# Patient Record
Sex: Female | Born: 1945 | Race: White | Hispanic: No | Smoking: Current every day smoker
Health system: Southern US, Community
[De-identification: ages and names within clinical notes are randomized; demographics above are authoritative.]

## PROBLEM LIST (undated history)

## (undated) DIAGNOSIS — R350 Frequency of micturition: Secondary | ICD-10-CM

## (undated) DIAGNOSIS — R569 Unspecified convulsions: Secondary | ICD-10-CM

## (undated) HISTORY — PX: KNEE ARTHROSCOPY: SUR90

## (undated) HISTORY — PX: OTHER SURGICAL HISTORY: SHX169

---

## 2008-05-06 ENCOUNTER — Emergency Department (HOSPITAL_BASED_OUTPATIENT_CLINIC_OR_DEPARTMENT_OTHER): Admission: EM | Admit: 2008-05-06 | Discharge: 2008-05-06 | Payer: Self-pay | Admitting: Emergency Medicine

## 2009-11-13 ENCOUNTER — Ambulatory Visit: Payer: Self-pay | Admitting: Family Medicine

## 2009-11-13 DIAGNOSIS — M545 Low back pain: Secondary | ICD-10-CM

## 2009-11-13 DIAGNOSIS — S8000XA Contusion of unspecified knee, initial encounter: Secondary | ICD-10-CM

## 2009-11-13 DIAGNOSIS — S139XXA Sprain of joints and ligaments of unspecified parts of neck, initial encounter: Secondary | ICD-10-CM

## 2009-11-13 DIAGNOSIS — S239XXA Sprain of unspecified parts of thorax, initial encounter: Secondary | ICD-10-CM

## 2009-11-13 DIAGNOSIS — R569 Unspecified convulsions: Secondary | ICD-10-CM | POA: Insufficient documentation

## 2009-11-19 ENCOUNTER — Encounter: Admission: RE | Admit: 2009-11-19 | Discharge: 2009-11-19 | Payer: Self-pay | Admitting: Orthopedic Surgery

## 2009-11-25 ENCOUNTER — Encounter: Admission: RE | Admit: 2009-11-25 | Discharge: 2010-02-08 | Payer: Self-pay | Admitting: Orthopedic Surgery

## 2010-07-13 NOTE — Letter (Signed)
Summary: Out of Work  [facility]  [address]   [place], [place] [address]   Phone: [phone]  Fax: [phone]    November 13, 2009   Employee:  Stacey Woodard    To Whom It May Concern:   For Medical reasons, please excuse the above named employee from work today.    If you need additional information, please feel free to contact our office.         Sincerely,    Donna Christen MD
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
## 2011-08-03 ENCOUNTER — Ambulatory Visit
Admission: RE | Admit: 2011-08-03 | Discharge: 2011-08-03 | Disposition: A | Discharge status: Home / Self Care | Payer: No Typology Code available for payment source | Source: Ambulatory Visit | Attending: Family Medicine | Admitting: Family Medicine

## 2011-08-03 ENCOUNTER — Other Ambulatory Visit: Payer: Self-pay | Admitting: Family Medicine

## 2011-08-03 DIAGNOSIS — R609 Edema, unspecified: Secondary | ICD-10-CM

## 2011-08-03 DIAGNOSIS — R52 Pain, unspecified: Secondary | ICD-10-CM

## 2011-08-29 ENCOUNTER — Other Ambulatory Visit: Payer: Self-pay | Admitting: Family Medicine

## 2011-08-29 ENCOUNTER — Ambulatory Visit
Admission: RE | Admit: 2011-08-29 | Discharge: 2011-08-29 | Disposition: A | Discharge status: Home / Self Care | Payer: No Typology Code available for payment source | Source: Ambulatory Visit | Attending: Family Medicine | Admitting: Family Medicine

## 2011-08-29 DIAGNOSIS — R52 Pain, unspecified: Secondary | ICD-10-CM

## 2011-10-05 ENCOUNTER — Ambulatory Visit: Payer: Worker's Compensation | Attending: Orthopedic Surgery | Admitting: Physical Therapy

## 2011-10-05 DIAGNOSIS — M2560 Stiffness of unspecified joint, not elsewhere classified: Secondary | ICD-10-CM | POA: Insufficient documentation

## 2011-10-05 DIAGNOSIS — M25539 Pain in unspecified wrist: Secondary | ICD-10-CM | POA: Insufficient documentation

## 2011-10-05 DIAGNOSIS — IMO0001 Reserved for inherently not codable concepts without codable children: Secondary | ICD-10-CM | POA: Insufficient documentation

## 2011-10-07 ENCOUNTER — Ambulatory Visit: Payer: Worker's Compensation | Admitting: Physical Therapy

## 2011-10-11 ENCOUNTER — Ambulatory Visit: Payer: Worker's Compensation | Admitting: Physical Therapy

## 2011-10-14 ENCOUNTER — Ambulatory Visit: Payer: Worker's Compensation | Attending: Orthopedic Surgery | Admitting: Physical Therapy

## 2011-10-14 DIAGNOSIS — M25539 Pain in unspecified wrist: Secondary | ICD-10-CM | POA: Insufficient documentation

## 2011-10-14 DIAGNOSIS — M2560 Stiffness of unspecified joint, not elsewhere classified: Secondary | ICD-10-CM | POA: Insufficient documentation

## 2011-10-14 DIAGNOSIS — IMO0001 Reserved for inherently not codable concepts without codable children: Secondary | ICD-10-CM | POA: Insufficient documentation

## 2011-10-18 ENCOUNTER — Encounter: Payer: Self-pay | Admitting: Physical Therapy

## 2011-10-21 ENCOUNTER — Ambulatory Visit: Payer: Worker's Compensation | Admitting: Physical Therapy

## 2011-10-25 ENCOUNTER — Ambulatory Visit: Payer: Worker's Compensation | Attending: Orthopedic Surgery | Admitting: Physical Therapy

## 2011-10-25 DIAGNOSIS — M25539 Pain in unspecified wrist: Secondary | ICD-10-CM | POA: Insufficient documentation

## 2011-10-25 DIAGNOSIS — IMO0001 Reserved for inherently not codable concepts without codable children: Secondary | ICD-10-CM | POA: Insufficient documentation

## 2011-10-25 DIAGNOSIS — M2560 Stiffness of unspecified joint, not elsewhere classified: Secondary | ICD-10-CM | POA: Insufficient documentation

## 2011-10-28 ENCOUNTER — Ambulatory Visit: Payer: Worker's Compensation | Admitting: Physical Therapy

## 2011-11-01 ENCOUNTER — Ambulatory Visit: Payer: Worker's Compensation | Attending: Orthopedic Surgery | Admitting: Physical Therapy

## 2011-11-01 DIAGNOSIS — IMO0001 Reserved for inherently not codable concepts without codable children: Secondary | ICD-10-CM | POA: Insufficient documentation

## 2011-11-01 DIAGNOSIS — M25539 Pain in unspecified wrist: Secondary | ICD-10-CM | POA: Insufficient documentation

## 2011-11-01 DIAGNOSIS — M2560 Stiffness of unspecified joint, not elsewhere classified: Secondary | ICD-10-CM | POA: Insufficient documentation

## 2011-11-03 ENCOUNTER — Ambulatory Visit: Payer: Worker's Compensation | Attending: Orthopedic Surgery | Admitting: Physical Therapy

## 2011-11-03 DIAGNOSIS — IMO0001 Reserved for inherently not codable concepts without codable children: Secondary | ICD-10-CM | POA: Insufficient documentation

## 2011-11-03 DIAGNOSIS — M25539 Pain in unspecified wrist: Secondary | ICD-10-CM | POA: Insufficient documentation

## 2011-11-03 DIAGNOSIS — M2560 Stiffness of unspecified joint, not elsewhere classified: Secondary | ICD-10-CM | POA: Insufficient documentation

## 2011-11-04 ENCOUNTER — Encounter: Payer: Self-pay | Admitting: Physical Therapy

## 2011-11-08 ENCOUNTER — Encounter: Payer: Self-pay | Admitting: Physical Therapy

## 2011-11-11 ENCOUNTER — Encounter: Payer: Self-pay | Admitting: Physical Therapy

## 2014-01-09 ENCOUNTER — Encounter: Payer: Self-pay | Admitting: Emergency Medicine

## 2014-01-09 ENCOUNTER — Emergency Department (INDEPENDENT_AMBULATORY_CARE_PROVIDER_SITE_OTHER): Payer: Self-pay

## 2014-01-09 ENCOUNTER — Emergency Department (INDEPENDENT_AMBULATORY_CARE_PROVIDER_SITE_OTHER)
Admission: EM | Admit: 2014-01-09 | Discharge: 2014-01-09 | Disposition: A | Discharge status: Home / Self Care | Payer: Self-pay | Source: Home / Self Care

## 2014-01-09 DIAGNOSIS — S46819A Strain of other muscles, fascia and tendons at shoulder and upper arm level, unspecified arm, initial encounter: Secondary | ICD-10-CM

## 2014-01-09 DIAGNOSIS — R202 Paresthesia of skin: Secondary | ICD-10-CM

## 2014-01-09 DIAGNOSIS — M25519 Pain in unspecified shoulder: Secondary | ICD-10-CM

## 2014-01-09 DIAGNOSIS — S46212A Strain of muscle, fascia and tendon of other parts of biceps, left arm, initial encounter: Secondary | ICD-10-CM

## 2014-01-09 DIAGNOSIS — M25529 Pain in unspecified elbow: Secondary | ICD-10-CM

## 2014-01-09 DIAGNOSIS — S43499A Other sprain of unspecified shoulder joint, initial encounter: Secondary | ICD-10-CM

## 2014-01-09 DIAGNOSIS — R200 Anesthesia of skin: Secondary | ICD-10-CM

## 2014-01-09 DIAGNOSIS — Y990 Civilian activity done for income or pay: Secondary | ICD-10-CM

## 2014-01-09 HISTORY — DX: Frequency of micturition: R35.0

## 2014-01-09 HISTORY — DX: Unspecified convulsions: R56.9

## 2014-01-09 MED ORDER — HYDROCODONE-ACETAMINOPHEN 10-325 MG PO TABS: 1.0000 | ORAL_TABLET | Freq: Three times a day (TID) | ORAL | Status: DC | PRN

## 2014-01-09 MED ORDER — KETOROLAC TROMETHAMINE 30 MG/ML IJ SOLN
30.0000 mg | Freq: Once | INTRAMUSCULAR | Status: AC
  Administered 2014-01-09: 30 mg via INTRAMUSCULAR

## 2014-01-09 MED ORDER — MELOXICAM 15 MG PO TABS: ORAL_TABLET | ORAL | Status: DC
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
## 2014-01-09 NOTE — Discharge Instructions (Signed)
Biceps Tendon Tendinitis (Proximal) and Tenosynovitis with Rehab Tendonitis and tenosynovitis involve inflammation of the tendon and the tendon lining (sheath). The proximal biceps tendon is vulnerable to tendonitis and tenosynovitis, which causes pain and discomfort in the front of the shoulder and upper arm. The tendon lining secretes a fluid that helps lubricate the tendon, allowing for proper function without pain. When the tendon and its lining become inflamed, the tendon can no longer glide smoothly, causing pain. The proximal biceps tendon connects the biceps muscle to two bones of the shoulder. It is important for proper function of the elbow and turning the palm upward (supination) using the wrist. Proximal biceps tendon tendinitis may include a grade 1 or 2 strain of the tendon. Grade 1 strains involve a slight pull of the tendon without signs of tearing and no observed tendon lengthening. There is also no loss of strength. Grade 2 strains involve small tears in the tendon fibers. The tendon or muscle is stretched and strength is usually decreased.  SYMPTOMS   Pain, tenderness, swelling, warmth, or redness over the front of the shoulder.  Pain that gets worse with shoulder and elbow use, especially against resistance.  Limited motion of the shoulder or elbow.  Crackling sound (crepitation) when the tendon or shoulder is moved or touched. CAUSES  The symptoms of biceps tendonitis are due to inflammation of the tendon. Inflammation may be caused by:  Strain from sudden increase in amount or intensity of activity.  Direct blow or injury to the elbow (uncommon).  Overuse or repetitive elbow bending or wrist rotation, particularly when turning the palm up, or with elbow hyperextension. RISK INCREASES WITH:  Sports that involve contact or overhead arm activity (throwing sports, gymnastics, weightlifting, bodybuilding, rock climbing).  Heavy labor.  Poor strength and  flexibility.  Failure to warm up properly before activity. PREVENTION  Warm up and stretch properly before activity.  Allow time for recovery between activities.  Maintain physical fitness:  Strength, flexibility, and endurance.  Cardiovascular fitness.  Learn and use proper exercise technique. PROGNOSIS  With proper treatment, proximal biceps tendon tendonitis and tenosynovitis is usually curable within 6 weeks. Healing is usually quicker if the cause was a direct blow, not overuse.  RELATED COMPLICATIONS   Longer healing time if not properly treated or if not given enough time to heal.  Chronically inflamed tendon that causes persistent pain with activity, that may progress to constant pain and potentially rupture of the tendon.  Recurring symptoms, especially if activity is resumed too soon or with overuse, a direct blow, or use of poor exercise technique. TREATMENT Treatment first involves ice and medicine, to reduce pain and inflammation. It is helpful to modify activities that cause pain, to reduce the chances of causing the condition to get worse. Strengthening and stretching exercises should be performed to promote proper use of the muscles of the shoulder. These exercises may be performed at home or with a therapist. Other treatments may be given such as ultrasound or heat therapy. A corticosteroid injection may be recommended to help reduce inflammation of the tendon lining. Surgery is usually not necessary. Sometimes, if symptoms last for greater than 6 months, surgery will be advised to detach the tendon and re-insert it into the arm bone. Surgery to correct other shoulder problems that may be contributing to tendinitis may be advised before surgery for the tendinitis itself.  MEDICATION  If pain medicine is needed, nonsteroidal anti-inflammatory medicines (aspirin and ibuprofen), or other minor pain relievers (  acetaminophen), are often advised.  Do not take pain medicine  for 7 days before surgery.  Prescription pain relievers may be given if your caregiver thinks they are needed. Use only as directed and only as much as you need.  Corticosteroid injections may be given. These injections should only be used on the most severe cases, as one can only receive a limited number of them. HEAT AND COLD   Cold treatment (icing) should be applied for 10 to 15 minutes every 2 to 3 hours for inflammation and pain, and immediately after activity that aggravates your symptoms. Use ice packs or an ice massage.  Heat treatment may be used before performing stretching and strengthening activities prescribed by your caregiver, physical therapist, or athletic trainer. Use a heat pack or a warm water soak. SEEK MEDICAL CARE IF:   Symptoms get worse or do not improve in 2 weeks, despite treatment.  New, unexplained symptoms develop. (Drugs used in treatment may produce side effects.) EXERCISES RANGE OF MOTION (ROM) AND EXERCISES - Biceps Tendon (Proximal) and Tenosynovitis These exercises may help you when beginning to rehabilitate your injury. Your symptoms may go away with or without further involvement from your physician, physical therapist, or athletic trainer. While completing these exercises, remember:   Restoring tissue flexibility helps normal motion to return to the joints. This allows healthier, less painful movement and activity.  An effective stretch should be held for at least 30 seconds.  A stretch should never be painful. You should only feel a gentle lengthening or release in the stretched tissue. STRETCH - Flexion, Standing  Stand with good posture. With an underhand grip on your right / left hand and an overhand grip on the opposite hand, grasp a broomstick or cane so that your hands are a little more than shoulder width apart.  Keeping your right / left elbow straight and shoulder muscles relaxed, push the stick with your opposite hand to raise your right  / left arm in front of your body and then overhead. Raise your arm until you feel a stretch in your right / left shoulder, but before you have increased shoulder pain.  Try to avoid shrugging your right / left shoulder as your arm rises, by keeping your shoulder blade tucked down and toward your mid-back spine. Hold for __________ seconds.  Slowly return to the starting position. Repeat __________ times. Complete this exercise __________ times per day. STRETCH - Abduction, Supine  Lie on your back. With an underhand grip on your right / left hand and an overhand grip on the opposite hand, grasp a broomstick or cane so that your hands are a little more than shoulder width apart.  Keeping your right / left elbow straight and shoulder muscles relaxed, push the stick with your opposite hand to raise your right / left arm out to the side of your body and then overhead. Raise your arm until you feel a stretch in your right / left shoulder, but before you have increased shoulder pain.  Try to avoid shrugging your right / left shoulder as your arm rises, by keeping your shoulder blade tucked down and toward your mid-back spine. Hold for __________ seconds.  Slowly return to the starting position. Repeat __________ times. Complete this exercise __________ times per day. ROM - Flexion, Active-Assisted  Lie on your back. You may bend your knees for comfort.  Grasp a broomstick or cane so your hands are about shoulder width apart. Your right / left hand should   grip the end of the stick so that your hand is positioned "thumbs-up," as if you were about to shake hands.  Using your healthy arm to lead, raise your right / left arm overhead until you feel a gentle stretch in your shoulder. Hold for __________ seconds.  Use the stick to assist in returning your right / left arm to its starting position. Repeat __________ times. Complete this exercise __________ times per day.  STRETCH - Flexion, Standing    Stand facing a wall. Walk your right / left fingers up the wall until you feel a moderate stretch in your shoulder. As your hand gets higher, you may need to step closer to the wall or use a door frame to walk through.  Try to avoid shrugging your right / left shoulder as your arm rises, by keeping your shoulder blade tucked down and toward your mid-back spine.  Hold for __________ seconds. Use your other hand, if needed, to ease out of the stretch and return to the starting position. Repeat __________ times. Complete this exercise __________ times per day.  ROM - Internal Rotation   Using underhand grips, grasp a stick behind your back with both hands.  While standing upright with good posture, slide the stick up your back until you feel a mild stretch in the front of your shoulder.  Hold for __________ seconds. Slowly return to your starting position. Repeat __________ times. Complete this exercise __________ times per day.  STRETCH - Internal Rotation  Place your right / left hand behind your back, palm-up.  Throw a towel or belt over your opposite shoulder. Grasp the towel with your right / left hand.  While keeping an upright posture, gently pull up on the towel until you feel a stretch in the front of your right / left shoulder.  Avoid shrugging your right / left shoulder as your arm rises, by keeping your shoulder blade tucked down and toward your mid-back spine.  Hold for __________ seconds. Release the stretch by lowering your opposite hand. Repeat __________ times. Complete this exercise __________ times per day. STRENGTHENING EXERCISES - Biceps Tendon Tendinitis (Proximal) and Tenosynovitis These exercises may help you regain your strength after your physician has discontinued your restraint in a cast or brace. They may resolve your symptoms with or without further involvement from your physician, physical therapist or athletic trainer. While completing these exercises,  remember:   Muscles can gain both the endurance and the strength needed for everyday activities through controlled exercises.  Complete these exercises as instructed by your physician, physical therapist or athletic trainer. Increase the resistance and repetitions only as guided.  You may experience muscle soreness or fatigue, but the pain or discomfort you are trying to eliminate should never worsen during these exercises. If this pain does get worse, stop and make sure you are following the directions exactly. If the pain is still present after adjustments, discontinue the exercise until you can discuss the trouble with your caregiver. STRENGTH - Elbow Flexors, Isometric  Stand or sit upright on a firm surface. Place your right / left arm so that your hand is palm-up and at the height of your waist.  Place your opposite hand on top of your forearm. Gently push down as your right / left arm resists. Push as hard as you can with both arms, without causing any pain or movement at your right / left elbow. Hold this stationary position for __________ seconds.  Gradually release the tension in both   arms. Allow your muscles to relax completely before repeating. Repeat __________ times. Complete this exercise __________ times per day. STRENGTH - Shoulder Flexion, Isometric  With good posture and facing a wall, stand or sit about 4-6 inches away.  Keeping your right / left elbow straight, gently press the top of your fist into the wall. Increase the pressure gradually until you are pressing as hard as you can, without shrugging your shoulder or increasing any shoulder discomfort.  Hold for __________ seconds.  Release the tension slowly. Relax your shoulder muscles completely before you start the next repetition. Repeat __________ times. Complete this exercise __________ times per day.  STRENGTH - Elbow Flexors, Supinated  With good posture, stand or sit on a firm chair without armrests. Allow  your right / left arm to rest at your side with your palm facing forward.  Holding a __________ weight, or gripping a rubber exercise band or tubing,  bring your hand toward your shoulder.  Allow your muscles to control the resistance as your hand returns to your side. Repeat __________ times. Complete this exercise __________ times per day.  STRENGTH - Shoulder Flexion  Stand or sit with good posture. Grasp a __________ weight, or an exercise band or tubing, so that your hand is "thumbs-up," like when you shake hands.  Slowly lift your right / left arm as far as you can, without increasing any shoulder pain. At first, many people can only raise their hand to shoulder height.  Avoid shrugging your right / left shoulder as your arm rises, by keeping your shoulder blade tucked down and toward your mid-back spine.  Hold for __________ seconds. Control the descent of your hand as you slowly return to your starting position. Repeat __________ times. Complete this exercise __________ times per day. Document Released: 05/30/2005 Document Revised: 08/22/2011 Document Reviewed: 09/11/2008 [employer] Patient Information 2015 [employer]. This information is not intended to replace advice given to you by your health care provider. Make sure you discuss any questions you have with your health care provider.  
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
## 2014-01-12 NOTE — ED Provider Notes (Signed)
Agree with exam, assessment, and plan.   Lattie HawStephen A Beese, MD 01/12/14 [phone]

## 2014-01-17 ENCOUNTER — Ambulatory Visit (INDEPENDENT_AMBULATORY_CARE_PROVIDER_SITE_OTHER): Payer: Worker's Compensation | Admitting: Sports Medicine

## 2014-01-17 ENCOUNTER — Encounter: Payer: Self-pay | Admitting: Sports Medicine

## 2014-01-17 VITALS — BP 119/73 | HR 71 | Ht 62.0 in | Wt 175.0 lb

## 2014-01-17 DIAGNOSIS — M19012 Primary osteoarthritis, left shoulder: Secondary | ICD-10-CM | POA: Insufficient documentation

## 2014-01-17 DIAGNOSIS — M25512 Pain in left shoulder: Secondary | ICD-10-CM

## 2014-01-17 DIAGNOSIS — R569 Unspecified convulsions: Secondary | ICD-10-CM

## 2014-01-17 DIAGNOSIS — M25519 Pain in unspecified shoulder: Secondary | ICD-10-CM

## 2014-01-17 NOTE — Assessment & Plan Note (Signed)
Likely a left subscapularis strain. Formal physical therapy, Pennsaid samples given. Return to see me in 4 weeks.
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
## 2014-01-28 ENCOUNTER — Ambulatory Visit (INDEPENDENT_AMBULATORY_CARE_PROVIDER_SITE_OTHER): Payer: Worker's Compensation | Admitting: Physical Therapy

## 2014-01-28 DIAGNOSIS — M25519 Pain in unspecified shoulder: Secondary | ICD-10-CM

## 2014-01-28 DIAGNOSIS — M25619 Stiffness of unspecified shoulder, not elsewhere classified: Secondary | ICD-10-CM | POA: Diagnosis not present

## 2014-01-28 DIAGNOSIS — M6281 Muscle weakness (generalized): Secondary | ICD-10-CM | POA: Diagnosis not present

## 2014-01-31 ENCOUNTER — Encounter (INDEPENDENT_AMBULATORY_CARE_PROVIDER_SITE_OTHER): Payer: Worker's Compensation | Admitting: Physical Therapy

## 2014-01-31 DIAGNOSIS — M25519 Pain in unspecified shoulder: Secondary | ICD-10-CM

## 2014-01-31 DIAGNOSIS — M25619 Stiffness of unspecified shoulder, not elsewhere classified: Secondary | ICD-10-CM

## 2014-01-31 DIAGNOSIS — M6281 Muscle weakness (generalized): Secondary | ICD-10-CM

## 2014-02-03 ENCOUNTER — Encounter (INDEPENDENT_AMBULATORY_CARE_PROVIDER_SITE_OTHER): Payer: Worker's Compensation | Admitting: Physical Therapy

## 2014-02-03 DIAGNOSIS — M6281 Muscle weakness (generalized): Secondary | ICD-10-CM

## 2014-02-03 DIAGNOSIS — M25519 Pain in unspecified shoulder: Secondary | ICD-10-CM

## 2014-02-03 DIAGNOSIS — M25619 Stiffness of unspecified shoulder, not elsewhere classified: Secondary | ICD-10-CM | POA: Diagnosis not present

## 2014-02-05 ENCOUNTER — Telehealth: Payer: Self-pay

## 2014-02-05 ENCOUNTER — Encounter (INDEPENDENT_AMBULATORY_CARE_PROVIDER_SITE_OTHER): Payer: Worker's Compensation | Admitting: Physical Therapy

## 2014-02-05 DIAGNOSIS — M25619 Stiffness of unspecified shoulder, not elsewhere classified: Secondary | ICD-10-CM | POA: Diagnosis not present

## 2014-02-05 DIAGNOSIS — M25519 Pain in unspecified shoulder: Secondary | ICD-10-CM | POA: Diagnosis not present

## 2014-02-05 DIAGNOSIS — M6281 Muscle weakness (generalized): Secondary | ICD-10-CM

## 2014-02-05 NOTE — Telephone Encounter (Signed)
FYI  Patient states she handed in her restricted return to work note and was advised she was no longer needed. She just wanted Dr Benjamin Stain to know just in case her lawyer needs records in the future.

## 2014-02-05 NOTE — Telephone Encounter (Signed)
Patient called and left a message on nurse line asking for a return call.   Returned Call: Left message asking patient to call back.  

## 2014-02-07 ENCOUNTER — Encounter: Payer: Self-pay | Admitting: Physical Therapy

## 2014-02-10 ENCOUNTER — Encounter (INDEPENDENT_AMBULATORY_CARE_PROVIDER_SITE_OTHER): Payer: Worker's Compensation | Admitting: Physical Therapy

## 2014-02-10 DIAGNOSIS — M25519 Pain in unspecified shoulder: Secondary | ICD-10-CM

## 2014-02-10 DIAGNOSIS — M6281 Muscle weakness (generalized): Secondary | ICD-10-CM | POA: Diagnosis not present

## 2014-02-10 DIAGNOSIS — M25619 Stiffness of unspecified shoulder, not elsewhere classified: Secondary | ICD-10-CM

## 2014-02-12 ENCOUNTER — Encounter (INDEPENDENT_AMBULATORY_CARE_PROVIDER_SITE_OTHER): Payer: Worker's Compensation | Admitting: Physical Therapy

## 2014-02-12 DIAGNOSIS — M25519 Pain in unspecified shoulder: Secondary | ICD-10-CM

## 2014-02-12 DIAGNOSIS — M25619 Stiffness of unspecified shoulder, not elsewhere classified: Secondary | ICD-10-CM

## 2014-02-12 DIAGNOSIS — M6281 Muscle weakness (generalized): Secondary | ICD-10-CM

## 2014-02-14 ENCOUNTER — Ambulatory Visit (INDEPENDENT_AMBULATORY_CARE_PROVIDER_SITE_OTHER): Payer: Worker's Compensation | Admitting: Sports Medicine

## 2014-02-14 ENCOUNTER — Encounter: Payer: Self-pay | Admitting: Sports Medicine

## 2014-02-14 ENCOUNTER — Encounter (INDEPENDENT_AMBULATORY_CARE_PROVIDER_SITE_OTHER): Payer: Worker's Compensation | Admitting: Physical Therapy

## 2014-02-14 VITALS — BP 126/72 | HR 59 | Ht 63.0 in | Wt 176.0 lb

## 2014-02-14 DIAGNOSIS — M6281 Muscle weakness (generalized): Secondary | ICD-10-CM

## 2014-02-14 DIAGNOSIS — M25512 Pain in left shoulder: Secondary | ICD-10-CM

## 2014-02-14 DIAGNOSIS — M25519 Pain in unspecified shoulder: Secondary | ICD-10-CM

## 2014-02-14 DIAGNOSIS — M25619 Stiffness of unspecified shoulder, not elsewhere classified: Secondary | ICD-10-CM

## 2014-02-14 MED ORDER — DICLOFENAC SODIUM 75 MG PO TBEC: 75.0000 mg | DELAYED_RELEASE_TABLET | Freq: Two times a day (BID) | ORAL | Status: DC
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
## 2014-02-14 NOTE — Assessment & Plan Note (Signed)
Improved significantly with formal PT, symptoms continued to be referable to the subscapularis. We're going to add diclofenac, she will continue physical therapy, and return in a month, if no better then we will consider interventional treatment. Return in 4 weeks.

## 2014-02-19 ENCOUNTER — Encounter (INDEPENDENT_AMBULATORY_CARE_PROVIDER_SITE_OTHER): Payer: Worker's Compensation | Admitting: Physical Therapy

## 2014-02-19 DIAGNOSIS — M25519 Pain in unspecified shoulder: Secondary | ICD-10-CM | POA: Diagnosis not present

## 2014-02-19 DIAGNOSIS — M6281 Muscle weakness (generalized): Secondary | ICD-10-CM | POA: Diagnosis not present

## 2014-02-19 DIAGNOSIS — M25619 Stiffness of unspecified shoulder, not elsewhere classified: Secondary | ICD-10-CM | POA: Diagnosis not present

## 2014-02-21 ENCOUNTER — Encounter: Payer: Self-pay | Admitting: Physical Therapy

## 2014-02-24 ENCOUNTER — Encounter (INDEPENDENT_AMBULATORY_CARE_PROVIDER_SITE_OTHER): Payer: Worker's Compensation | Admitting: Physical Therapy

## 2014-02-24 DIAGNOSIS — M6281 Muscle weakness (generalized): Secondary | ICD-10-CM

## 2014-02-24 DIAGNOSIS — M25519 Pain in unspecified shoulder: Secondary | ICD-10-CM

## 2014-02-24 DIAGNOSIS — M25619 Stiffness of unspecified shoulder, not elsewhere classified: Secondary | ICD-10-CM | POA: Diagnosis not present

## 2014-02-26 ENCOUNTER — Encounter (INDEPENDENT_AMBULATORY_CARE_PROVIDER_SITE_OTHER): Payer: Worker's Compensation | Admitting: Physical Therapy

## 2014-02-26 DIAGNOSIS — M25619 Stiffness of unspecified shoulder, not elsewhere classified: Secondary | ICD-10-CM | POA: Diagnosis not present

## 2014-02-26 DIAGNOSIS — M6281 Muscle weakness (generalized): Secondary | ICD-10-CM

## 2014-02-26 DIAGNOSIS — M25519 Pain in unspecified shoulder: Secondary | ICD-10-CM

## 2014-02-27 ENCOUNTER — Encounter (INDEPENDENT_AMBULATORY_CARE_PROVIDER_SITE_OTHER): Payer: Worker's Compensation | Admitting: Physical Therapy

## 2014-02-27 DIAGNOSIS — M6281 Muscle weakness (generalized): Secondary | ICD-10-CM | POA: Diagnosis not present

## 2014-02-27 DIAGNOSIS — M25619 Stiffness of unspecified shoulder, not elsewhere classified: Secondary | ICD-10-CM

## 2014-02-27 DIAGNOSIS — M25519 Pain in unspecified shoulder: Secondary | ICD-10-CM

## 2014-03-04 ENCOUNTER — Encounter (INDEPENDENT_AMBULATORY_CARE_PROVIDER_SITE_OTHER): Payer: Worker's Compensation | Admitting: Physical Therapy

## 2014-03-04 DIAGNOSIS — M25519 Pain in unspecified shoulder: Secondary | ICD-10-CM

## 2014-03-04 DIAGNOSIS — M25619 Stiffness of unspecified shoulder, not elsewhere classified: Secondary | ICD-10-CM

## 2014-03-04 DIAGNOSIS — M6281 Muscle weakness (generalized): Secondary | ICD-10-CM

## 2014-03-06 ENCOUNTER — Encounter (INDEPENDENT_AMBULATORY_CARE_PROVIDER_SITE_OTHER): Payer: Worker's Compensation | Admitting: Physical Therapy

## 2014-03-06 DIAGNOSIS — M6281 Muscle weakness (generalized): Secondary | ICD-10-CM | POA: Diagnosis not present

## 2014-03-06 DIAGNOSIS — M25519 Pain in unspecified shoulder: Secondary | ICD-10-CM

## 2014-03-06 DIAGNOSIS — M25619 Stiffness of unspecified shoulder, not elsewhere classified: Secondary | ICD-10-CM | POA: Diagnosis not present

## 2014-03-10 ENCOUNTER — Encounter: Payer: Self-pay | Admitting: Physical Therapy

## 2014-03-12 ENCOUNTER — Encounter (INDEPENDENT_AMBULATORY_CARE_PROVIDER_SITE_OTHER): Payer: Worker's Compensation

## 2014-03-12 DIAGNOSIS — M6281 Muscle weakness (generalized): Secondary | ICD-10-CM

## 2014-03-12 DIAGNOSIS — M25619 Stiffness of unspecified shoulder, not elsewhere classified: Secondary | ICD-10-CM | POA: Diagnosis not present

## 2014-03-12 DIAGNOSIS — M25519 Pain in unspecified shoulder: Secondary | ICD-10-CM

## 2014-03-14 ENCOUNTER — Ambulatory Visit (INDEPENDENT_AMBULATORY_CARE_PROVIDER_SITE_OTHER): Payer: Worker's Compensation | Admitting: Sports Medicine

## 2014-03-14 ENCOUNTER — Encounter: Payer: Self-pay | Admitting: Sports Medicine

## 2014-03-14 ENCOUNTER — Encounter (INDEPENDENT_AMBULATORY_CARE_PROVIDER_SITE_OTHER): Payer: Worker's Compensation | Admitting: Physical Therapy

## 2014-03-14 VITALS — BP 122/70 | HR 56 | Wt 179.0 lb

## 2014-03-14 DIAGNOSIS — M25619 Stiffness of unspecified shoulder, not elsewhere classified: Secondary | ICD-10-CM

## 2014-03-14 DIAGNOSIS — M25512 Pain in left shoulder: Secondary | ICD-10-CM

## 2014-03-14 DIAGNOSIS — M25519 Pain in unspecified shoulder: Secondary | ICD-10-CM

## 2014-03-14 DIAGNOSIS — M6281 Muscle weakness (generalized): Secondary | ICD-10-CM
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
## 2014-03-14 NOTE — Assessment & Plan Note (Signed)
Overall continues to improve with physical therapy. Declines injection therapy appropriately. Return in 4 weeks.

## 2014-03-21 ENCOUNTER — Encounter (INDEPENDENT_AMBULATORY_CARE_PROVIDER_SITE_OTHER): Payer: Self-pay

## 2014-03-21 DIAGNOSIS — M25619 Stiffness of unspecified shoulder, not elsewhere classified: Secondary | ICD-10-CM

## 2014-03-21 DIAGNOSIS — M25519 Pain in unspecified shoulder: Secondary | ICD-10-CM

## 2014-03-24 ENCOUNTER — Encounter (INDEPENDENT_AMBULATORY_CARE_PROVIDER_SITE_OTHER): Payer: Worker's Compensation | Admitting: Physical Therapy

## 2014-03-24 DIAGNOSIS — M6281 Muscle weakness (generalized): Secondary | ICD-10-CM

## 2014-03-24 DIAGNOSIS — M25619 Stiffness of unspecified shoulder, not elsewhere classified: Secondary | ICD-10-CM

## 2014-03-24 DIAGNOSIS — M25579 Pain in unspecified ankle and joints of unspecified foot: Secondary | ICD-10-CM

## 2014-03-24 DIAGNOSIS — M25519 Pain in unspecified shoulder: Secondary | ICD-10-CM

## 2014-03-28 ENCOUNTER — Encounter: Payer: Self-pay | Admitting: Physical Therapy

## 2014-04-01 ENCOUNTER — Encounter (INDEPENDENT_AMBULATORY_CARE_PROVIDER_SITE_OTHER): Payer: Worker's Compensation | Admitting: Physical Therapy

## 2014-04-01 DIAGNOSIS — M25619 Stiffness of unspecified shoulder, not elsewhere classified: Secondary | ICD-10-CM

## 2014-04-01 DIAGNOSIS — M25579 Pain in unspecified ankle and joints of unspecified foot: Secondary | ICD-10-CM

## 2014-04-01 DIAGNOSIS — M6281 Muscle weakness (generalized): Secondary | ICD-10-CM

## 2014-04-01 DIAGNOSIS — M25519 Pain in unspecified shoulder: Secondary | ICD-10-CM

## 2014-04-03 ENCOUNTER — Encounter: Payer: Self-pay | Admitting: Physical Therapy

## 2014-04-04 ENCOUNTER — Encounter (INDEPENDENT_AMBULATORY_CARE_PROVIDER_SITE_OTHER): Payer: Worker's Compensation | Admitting: Physical Therapy

## 2014-04-04 DIAGNOSIS — M25519 Pain in unspecified shoulder: Secondary | ICD-10-CM

## 2014-04-04 DIAGNOSIS — M25619 Stiffness of unspecified shoulder, not elsewhere classified: Secondary | ICD-10-CM

## 2014-04-11 ENCOUNTER — Encounter: Payer: Self-pay | Admitting: Sports Medicine

## 2014-04-11 ENCOUNTER — Ambulatory Visit (INDEPENDENT_AMBULATORY_CARE_PROVIDER_SITE_OTHER): Payer: Worker's Compensation | Admitting: Sports Medicine

## 2014-04-11 VITALS — BP 117/65 | HR 78 | Wt 176.0 lb

## 2014-04-11 DIAGNOSIS — M25512 Pain in left shoulder: Secondary | ICD-10-CM

## 2014-04-11 NOTE — Assessment & Plan Note (Signed)
Improved significantly with formal physical therapy and diclofenac. Pain now is not referable to the subacromial bursa, it is predominantly referable to the acromioclavicular joint. Acromioclavicular joint injection as above. Return in one month. If no better we will proceed with a subacromial injection.

## 2014-04-11 NOTE — Progress Notes (Signed)
  Subjective:    CC: Follow-up  HPI: Left shoulder pain: Persistent despite formal physical therapy, initially she was improving but unfortunately plateaued. Pain today is over the top of the shoulder and referable to the acromioclavicular joint. Moderate, persistent without radiation. Pain with overhead activities and over the deltoid has resolved after physical therapy.  Past medical history, Surgical history, Family history not pertinant except as noted below, Social history, Allergies, and medications have been entered into the medical record, reviewed, and no changes needed.   Review of Systems: No fevers, chills, night sweats, weight loss, chest pain, or shortness of breath.   Objective:    General: Well Developed, well nourished, and in no acute distress.  Neuro: Alert and oriented x3, extra-ocular muscles intact, sensation grossly intact.  HEENT: Normocephalic, atraumatic, pupils equal round reactive to light, neck supple, no masses, no lymphadenopathy, thyroid nonpalpable.  Skin: Warm and dry, no rashes. Cardiac: Regular rate and rhythm, no murmurs rubs or gallops, no lower extremity edema.  Respiratory: Clear to auscultation bilaterally. Not using accessory muscles, speaking in full sentences. Left Shoulder: Inspection reveals no abnormalities, atrophy or asymmetry. Tender to palpation over the acromioclavicular joint. ROM is full in all planes. Rotator cuff strength normal throughout. No signs of impingement with negative Neer and Hawkin's tests, empty can. Speeds and Yergason's tests normal. No labral pathology noted with negative Obrien's, negative crank, negative clunk, and good stability. Normal scapular function observed. No painful arc and no drop arm sign. No apprehension sign  Procedure: Real-time Ultrasound Guided Injection of left acromioclavicular joint Device: GE Logiq E  Verbal informed consent obtained.  Time-out conducted.  Noted no overlying erythema,  induration, or other signs of local infection.  Skin prepped in a sterile fashion.  Local anesthesia: Topical Ethyl chloride.  With sterile technique and under real time ultrasound guidance:  1 mL kenalog 40, 1 mL lidocaine injected easily Completed without difficulty  Pain immediately resolved suggesting accurate placement of the medication.  Advised to call if fevers/chills, erythema, induration, drainage, or persistent bleeding.  Images permanently stored and available for review in the ultrasound unit.  Impression: Technically successful ultrasound guided injection.  Impression and Recommendations:

## 2014-05-12 ENCOUNTER — Encounter: Payer: Self-pay | Admitting: Sports Medicine

## 2014-05-12 ENCOUNTER — Ambulatory Visit (INDEPENDENT_AMBULATORY_CARE_PROVIDER_SITE_OTHER): Payer: Worker's Compensation | Admitting: Sports Medicine

## 2014-05-12 DIAGNOSIS — M25512 Pain in left shoulder: Secondary | ICD-10-CM | POA: Diagnosis not present

## 2014-05-12 MED ORDER — DICLOFENAC SODIUM 2 % TD SOLN: 2.0000 | Freq: Two times a day (BID) | TRANSDERMAL | Status: DC

## 2014-05-12 NOTE — Assessment & Plan Note (Signed)
Essentially pain-free now after an acromioclavicular injection at the last visit. Doing okay with overhead activities. Continue rehabilitation exercises, continue Aleve twice a day, calling in Pennsaid. Return to see me as needed.

## 2014-05-12 NOTE — Progress Notes (Signed)
  Subjective:    CC: Recheck left shoulder  HPI: Stacey Woodard is a pleasant 68 year old female, I saw her at the last visit with left shoulder pain referable to the acromioclavicular joint. I injected the joint under guidance, she had already done formal physical therapy. She returns today essentially pain-free, happy with results, no longer being awakened at night. And only taking 2 Aleve per day. She doesn't with overhead activities however she will have to do some painting coming up in the next couple of weeks.  Past medical history, Surgical history, Family history not pertinant except as noted below, Social history, Allergies, and medications have been entered into the medical record, reviewed, and no changes needed.   Review of Systems: No fevers, chills, night sweats, weight loss, chest pain, or shortness of breath.   Objective:    General: Well Developed, well nourished, and in no acute distress.  Neuro: Alert and oriented x3, extra-ocular muscles intact, sensation grossly intact.  HEENT: Normocephalic, atraumatic, pupils equal round reactive to light, neck supple, no masses, no lymphadenopathy, thyroid nonpalpable.  Skin: Warm and dry, no rashes. Cardiac: Regular rate and rhythm, no murmurs rubs or gallops, no lower extremity edema.  Respiratory: Clear to auscultation bilaterally. Not using accessory muscles, speaking in full sentences. Left Shoulder: Inspection reveals no abnormalities, atrophy or asymmetry. Palpation is normal with no tenderness over AC joint or bicipital groove. ROM is full in all planes. Rotator cuff strength normal throughout. No signs of impingement with negative Neer and Hawkin's tests, empty can. Speeds and Yergason's tests normal. No labral pathology noted with negative Obrien's, negative crank, negative clunk, and good stability. Normal scapular function observed. No painful arc and no drop arm sign. No apprehension sign  Impression and Recommendations:

## 2014-08-12 ENCOUNTER — Ambulatory Visit (INDEPENDENT_AMBULATORY_CARE_PROVIDER_SITE_OTHER): Payer: Worker's Compensation | Admitting: Sports Medicine

## 2014-08-12 ENCOUNTER — Encounter: Payer: Self-pay | Admitting: Sports Medicine

## 2014-08-12 VITALS — BP 116/68 | HR 79 | Ht 64.0 in | Wt 179.0 lb

## 2014-08-12 DIAGNOSIS — M19012 Primary osteoarthritis, left shoulder: Secondary | ICD-10-CM

## 2014-08-12 DIAGNOSIS — M1990 Unspecified osteoarthritis, unspecified site: Secondary | ICD-10-CM

## 2014-08-12 MED ORDER — DICLOFENAC SODIUM 2 % TD SOLN: 2.0000 | Freq: Two times a day (BID) | TRANSDERMAL | Status: DC
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
## 2014-08-12 NOTE — Assessment & Plan Note (Signed)
Pain is referable to the left acromioclavicular joint.  four-month response to the previous acromial clavicular injection under ultrasound guidance, repeat injection today. Return to see me as needed.

## 2014-08-22 ENCOUNTER — Telehealth: Payer: Self-pay

## 2014-08-22 NOTE — Telephone Encounter (Signed)
[name] called and left a message asking what medications Stacey BridgeMartha was prescribed. I called her back. She would like records of the office visit so patient can receive the medication. No release of information is in the chart. I asked her to fax a signed release of medical records and I will fax the office notes.

## 2014-08-25 NOTE — Telephone Encounter (Signed)
Tell her it is on her checkout sheet.  I will also write a letter, but it should be cheap through [facility] anyway. Letter in box.

## 2014-08-25 NOTE — Telephone Encounter (Signed)
Patient called wanted a letter stating the medication that she is taking so she can fax it to her attorney. Iris Tatsch,CMA

## 2014-08-26 NOTE — Telephone Encounter (Signed)
Left message on patient vm advising her that letter was ready for pickup. Dorethia Jeanmarie,CMA

## 2014-09-08 ENCOUNTER — Telehealth: Payer: Self-pay

## 2014-09-08 MED ORDER — NAPROXEN-ESOMEPRAZOLE 500-20 MG PO TBEC: DELAYED_RELEASE_TABLET | ORAL | Status: AC

## 2014-09-08 NOTE — Telephone Encounter (Signed)
Lets just have her come pick up some vimovo samples.  Lets give her 5 boxes.

## 2014-09-08 NOTE — Telephone Encounter (Signed)
Spoke to patient advised her to come to office to pick up samples. Stacey Woodard,CMA

## 2014-09-08 NOTE — Telephone Encounter (Signed)
Patient called stated that her workermans comp is not paying for Diclofenac and it is expensive out of pocket  so she is requesting a cheaper medication sent to her pharmacy. Rhonda Cunningham,CMA

## 2014-12-22 ENCOUNTER — Ambulatory Visit (INDEPENDENT_AMBULATORY_CARE_PROVIDER_SITE_OTHER): Payer: Worker's Compensation | Admitting: Sports Medicine

## 2014-12-22 ENCOUNTER — Encounter: Payer: Self-pay | Admitting: Sports Medicine

## 2014-12-22 ENCOUNTER — Ambulatory Visit (INDEPENDENT_AMBULATORY_CARE_PROVIDER_SITE_OTHER): Payer: Worker's Compensation

## 2014-12-22 VITALS — BP 133/76 | HR 62 | Ht 64.0 in | Wt 179.0 lb

## 2014-12-22 DIAGNOSIS — M7550 Bursitis of unspecified shoulder: Secondary | ICD-10-CM | POA: Insufficient documentation

## 2014-12-22 DIAGNOSIS — M7552 Bursitis of left shoulder: Secondary | ICD-10-CM

## 2014-12-22 DIAGNOSIS — M1990 Unspecified osteoarthritis, unspecified site: Secondary | ICD-10-CM | POA: Diagnosis not present

## 2014-12-22 DIAGNOSIS — M19012 Primary osteoarthritis, left shoulder: Secondary | ICD-10-CM

## 2014-12-22 NOTE — Progress Notes (Signed)
  Subjective:    CC: Left shoulder pain  HPI: I have treated Stacey Woodard in the past for her left shoulder pain, she has acromioclavicular osteoarthritis, last injection was approximately 4-1/2 months ago. Recently she's had pain that she localizes over the deltoid, worse with overhead activities. Pain is moderate, persistent without radiation.  Past medical history, Surgical history, Family history not pertinant except as noted below, Social history, Allergies, and medications have been entered into the medical record, reviewed, and no changes needed.   Review of Systems: No fevers, chills, night sweats, weight loss, chest pain, or shortness of breath.   Objective:    General: Well Developed, well nourished, and in no acute distress.  Neuro: Alert and oriented x3, extra-ocular muscles intact, sensation grossly intact.  HEENT: Normocephalic, atraumatic, pupils equal round reactive to light, neck supple, no masses, no lymphadenopathy, thyroid nonpalpable.  Skin: Warm and dry, no rashes. Cardiac: Regular rate and rhythm, no murmurs rubs or gallops, no lower extremity edema.  Respiratory: Clear to auscultation bilaterally. Not using accessory muscles, speaking in full sentences. Left Shoulder: Inspection reveals no abnormalities, atrophy or asymmetry. Tender to palpation over the acromioclavicular joint ROM is full in all planes. Rotator cuff strength normal throughout. Positive negative Neer and Hawkin's tests, empty can. Speeds and Yergason's tests normal. No labral pathology noted with negative Obrien's, negative crank, negative clunk, and good stability. Normal scapular function observed. No painful arc and no drop arm sign. No apprehension sign.  Procedure: Real-time Ultrasound Guided Injection of left subacromial bursa Device: GE Logiq E  Verbal informed consent obtained.  Time-out conducted.  Noted no overlying erythema, induration, or other signs of local infection.  Skin prepped  in a sterile fashion.  Local anesthesia: Topical Ethyl chloride.  With sterile technique and under real time ultrasound guidance:  1 mL kenalog 40, 3 mL lidocaine injected easily. Completed without difficulty  Pain immediately resolved suggesting accurate placement of the medication.  Advised to call if fevers/chills, erythema, induration, drainage, or persistent bleeding.  Images permanently stored and available for review in the ultrasound unit.  Impression: Technically successful ultrasound guided injection.   Procedure: Real-time Ultrasound Guided Injection of left acromioclavicular joint Device: GE Logiq E  Verbal informed consent obtained.  Time-out conducted.  Noted no overlying erythema, induration, or other signs of local infection.  Skin prepped in a sterile fashion.  Local anesthesia: Topical Ethyl chloride.  With sterile technique and under real time ultrasound guidance:  0.5 mL kenalog 40, 0.5 mL lidocaine injected easily. Completed without difficulty  Pain immediately resolved suggesting accurate placement of the medication.  Advised to call if fevers/chills, erythema, induration, drainage, or persistent bleeding.  Images permanently stored and available for review in the ultrasound unit.  Impression: Technically successful ultrasound guided injection.  Impression and Recommendations:

## 2014-12-22 NOTE — Assessment & Plan Note (Signed)
Left acromioclavicular injection as well. Last injection was 4.5 months ago.

## 2014-12-22 NOTE — Assessment & Plan Note (Signed)
Symptoms today are impingement related.  Subacromial injection, formal physical therapy, x-rays.

## 2014-12-23 ENCOUNTER — Ambulatory Visit: Payer: Self-pay | Admitting: Sports Medicine

## 2014-12-26 ENCOUNTER — Telehealth: Payer: Self-pay

## 2014-12-26 ENCOUNTER — Ambulatory Visit: Payer: Self-pay | Admitting: Physical Therapy

## 2014-12-26 NOTE — Telephone Encounter (Signed)
Stacey Woodard needs a letter faxed to her lawyer Stacey Woodard. She needs an out of work note from July 2 until resolved.   Fax [phone]

## 2014-12-27 NOTE — Telephone Encounter (Signed)
I will write a note but it won't be an open ended out-of-work note.

## 2015-01-23 ENCOUNTER — Telehealth: Payer: Self-pay | Admitting: Sports Medicine

## 2015-01-23 ENCOUNTER — Encounter: Payer: Self-pay | Admitting: Sports Medicine

## 2015-01-23 ENCOUNTER — Ambulatory Visit (INDEPENDENT_AMBULATORY_CARE_PROVIDER_SITE_OTHER): Payer: Worker's Compensation | Admitting: Sports Medicine

## 2015-01-23 VITALS — BP 143/78 | HR 68 | Wt 174.0 lb

## 2015-01-23 DIAGNOSIS — M1990 Unspecified osteoarthritis, unspecified site: Secondary | ICD-10-CM

## 2015-01-23 DIAGNOSIS — M7552 Bursitis of left shoulder: Secondary | ICD-10-CM | POA: Diagnosis not present

## 2015-01-23 DIAGNOSIS — M19012 Primary osteoarthritis, left shoulder: Secondary | ICD-10-CM

## 2015-01-23 NOTE — Progress Notes (Signed)
  Subjective:    CC: Follow-up  HPI: Left shoulder pain: Doing well after subacromial and acromioclavicular injections at the last visit, it sounds as though her Worker's Comp. case is now closed, she has not been able to do any physical therapy, pain is mild, improving.  Past medical history, Surgical history, Family history not pertinant except as noted below, Social history, Allergies, and medications have been entered into the medical record, reviewed, and no changes needed.   Review of Systems: No fevers, chills, night sweats, weight loss, chest pain, or shortness of breath.   Objective:    General: Well Developed, well nourished, and in no acute distress.  Neuro: Alert and oriented x3, extra-ocular muscles intact, sensation grossly intact.  HEENT: Normocephalic, atraumatic, pupils equal round reactive to light, neck supple, no masses, no lymphadenopathy, thyroid nonpalpable.  Skin: Warm and dry, no rashes. Cardiac: Regular rate and rhythm, no murmurs rubs or gallops, no lower extremity edema.  Respiratory: Clear to auscultation bilaterally. Not using accessory muscles, speaking in full sentences. Left Shoulder: Inspection reveals no abnormalities, atrophy or asymmetry. Palpation is normal with no tenderness over AC joint or bicipital groove. ROM is full in all planes. Rotator cuff strength normal throughout. No signs of impingement with negative Neer and Hawkin's tests, empty can. Speeds and Yergason's tests normal. No labral pathology noted with negative Obrien's, negative crank, negative clunk, and good stability. Normal scapular function observed. No painful arc and no drop arm sign. No apprehension sign  Impression and Recommendations:    I spent 40 minutes with this patient, greater than 50% was face-to-face time counseling regarding the above diagnoses, the patient spent a great deal of time discussing her Worker's Comp. issues.

## 2015-01-23 NOTE — Telephone Encounter (Signed)
Patient needs the last 2 office notes faxed to the workers [employer], per her request.  thanks

## 2015-01-23 NOTE — Assessment & Plan Note (Signed)
At this point she needs formal physical therapy, has improved significantly after subacromial injection.

## 2015-01-23 NOTE — Assessment & Plan Note (Signed)
Significant improvement with acromioclavicular injection as well as subacromial injection, needs to do formal physical therapy, and we will route our note to her attorney.

## 2015-01-23 NOTE — Telephone Encounter (Signed)
Reading to [facility]

## 2015-01-27 ENCOUNTER — Telehealth: Payer: Self-pay | Admitting: Sports Medicine

## 2015-01-27 NOTE — Telephone Encounter (Signed)
Left detailed message on patient vm requesting her to call me back regarding a fax number to have her ov notes faxed. Rhonda Cunningham,CMA

## 2015-01-27 NOTE — Telephone Encounter (Signed)
Spoke to patient advised her that last 2 OV notes were faxed to [facility] @ [phone]. Oaklan Persons,CMA

## 2015-01-27 NOTE — Telephone Encounter (Signed)
Patient called and left a voicemail requesting that Bjorn Loser- Dr.T's nurse call her back at [phone]

## 2015-03-06 ENCOUNTER — Ambulatory Visit (INDEPENDENT_AMBULATORY_CARE_PROVIDER_SITE_OTHER): Payer: Worker's Compensation | Admitting: Sports Medicine

## 2015-03-06 ENCOUNTER — Encounter: Payer: Self-pay | Admitting: Sports Medicine

## 2015-03-06 VITALS — BP 147/56 | HR 66 | Wt 175.0 lb

## 2015-03-06 DIAGNOSIS — M1990 Unspecified osteoarthritis, unspecified site: Secondary | ICD-10-CM

## 2015-03-06 DIAGNOSIS — M19012 Primary osteoarthritis, left shoulder: Secondary | ICD-10-CM

## 2015-03-06 NOTE — Assessment & Plan Note (Signed)
Unfortunately has still not done formal physical therapy. She did respond moderately well to a subacromial and acromial clavicular injection. I would let to see her back only after she has done 4-6 weeks of formal physical therapy.

## 2015-03-06 NOTE — Progress Notes (Signed)
  Subjective:    CC: Follow-up  HPI: Stacey Woodard returns, she has known left acromial clinical or osteoarthritis and left subacromial bursitis, she responded well to acromial clavicular subacromial injections however never did her physical therapy, the Worker's Compensation case is not closed at this point. Unfortunately she continues to have pain, she does need a note from me specifically describing the plan of care.  Past medical history, Surgical history, Family history not pertinant except as noted below, Social history, Allergies, and medications have been entered into the medical record, reviewed, and no changes needed.   Review of Systems: No fevers, chills, night sweats, weight loss, chest pain, or shortness of breath.   Objective:    General: Well Developed, well nourished, and in no acute distress.  Neuro: Alert and oriented x3, extra-ocular muscles intact, sensation grossly intact.  HEENT: Normocephalic, atraumatic, pupils equal round reactive to light, neck supple, no masses, no lymphadenopathy, thyroid nonpalpable.  Skin: Warm and dry, no rashes. Cardiac: Regular rate and rhythm, no murmurs rubs or gallops, no lower extremity edema.  Respiratory: Clear to auscultation bilaterally. Not using accessory muscles, speaking in full sentences.  Impression and Recommendations:

## 2015-03-23 ENCOUNTER — Telehealth: Payer: Self-pay | Admitting: Sports Medicine

## 2015-03-23 NOTE — Telephone Encounter (Addendum)
Received call from Pt's daughter, Stacey Woodard, stating their lawyer needs the latest letter from Dr. Benjamin Stain revised. Currently, workman's comp has denied PT and they are not holding the Pt's job so there is no return to work information required. The Pt will not return to work, when she filled workman's comp they gave her job away. So new note needs to be reflective of treatment without returning to work. Will route to Provider for review.  Pt's daughter states to make sure the letter that Dr. Karie Schwalbe writes specifically states that her injury was due to a workman's comp injury and it needs to state the patients limitations and restrictions

## 2015-03-23 NOTE — Telephone Encounter (Signed)
Letter in box. 

## 2015-03-24 NOTE — Telephone Encounter (Signed)
Pt was notified letter was ready by Dr. Lucienne Minks assistant.

## 2015-04-17 ENCOUNTER — Ambulatory Visit (INDEPENDENT_AMBULATORY_CARE_PROVIDER_SITE_OTHER): Payer: Worker's Compensation | Admitting: Sports Medicine

## 2015-04-17 ENCOUNTER — Encounter: Payer: Self-pay | Admitting: Sports Medicine

## 2015-04-17 VITALS — BP 126/68 | HR 69 | Wt 175.0 lb

## 2015-04-17 DIAGNOSIS — M1990 Unspecified osteoarthritis, unspecified site: Secondary | ICD-10-CM

## 2015-04-17 DIAGNOSIS — M19012 Primary osteoarthritis, left shoulder: Secondary | ICD-10-CM

## 2015-04-17 DIAGNOSIS — M7552 Bursitis of left shoulder: Secondary | ICD-10-CM

## 2015-04-17 NOTE — Assessment & Plan Note (Signed)
Injection as above, formal PT

## 2015-04-17 NOTE — Progress Notes (Signed)
  Subjective:    CC: Left shoulder pain  HPI: Stacey Woodard returns, again she hasn't done any formal physical therapy, duration for her Worker's Comp. case his next year. She has severe pain both over the acromioclavicular joint and deltoid, worse with overhead activities, she desires repeat interventional treatment, pain is severe, persistent. No radiation.  Past medical history, Surgical history, Family history not pertinant except as noted below, Social history, Allergies, and medications have been entered into the medical record, reviewed, and no changes needed.   Review of Systems: No fevers, chills, night sweats, weight loss, chest pain, or shortness of breath.   Objective:    General: Well Developed, well nourished, and in no acute distress.  Neuro: Alert and oriented x3, extra-ocular muscles intact, sensation grossly intact.  HEENT: Normocephalic, atraumatic, pupils equal round reactive to light, neck supple, no masses, no lymphadenopathy, thyroid nonpalpable.  Skin: Warm and dry, no rashes. Cardiac: Regular rate and rhythm, no murmurs rubs or gallops, no lower extremity edema.  Respiratory: Clear to auscultation bilaterally. Not using accessory muscles, speaking in full sentences.  Procedure: Real-time Ultrasound Guided Injection of left acromioclavicular joint Device: GE Logiq E  Verbal informed consent obtained.  Time-out conducted.  Noted no overlying erythema, induration, or other signs of local infection.  Skin prepped in a sterile fashion.  Local anesthesia: Topical Ethyl chloride.  With sterile technique and under real time ultrasound guidance:  1 mL Kenalog 40, 1 mL lidocaine injected easily Completed without difficulty  Pain immediately resolved suggesting accurate placement of the medication.  Advised to call if fevers/chills, erythema, induration, drainage, or persistent bleeding.  Images permanently stored and available for review in the ultrasound unit.  Impression:  Technically successful ultrasound guided injection.  Procedure: Real-time Ultrasound Guided Injection of left subacromial bursa Device: GE Logiq E  Verbal informed consent obtained.  Time-out conducted.  Noted no overlying erythema, induration, or other signs of local infection.  Skin prepped in a sterile fashion.  Local anesthesia: Topical Ethyl chloride.  With sterile technique and under real time ultrasound guidance:  1 mL Kenalog 40, 3 mL lidocaine injected easily into the subacromial bursa taking care to avoid injection into the supraspinatus. Completed without difficulty  Pain immediately resolved suggesting accurate placement of the medication.  Advised to call if fevers/chills, erythema, induration, drainage, or persistent bleeding.  Images permanently stored and available for review in the ultrasound unit.  Impression: Technically successful ultrasound guided injection.  Impression and Recommendations:

## 2015-04-17 NOTE — Assessment & Plan Note (Signed)
Injection as above, formal PT 

## 2015-04-20 ENCOUNTER — Ambulatory Visit (INDEPENDENT_AMBULATORY_CARE_PROVIDER_SITE_OTHER): Payer: Worker's Compensation | Admitting: Rehabilitative and Restorative Service Providers"

## 2015-04-20 ENCOUNTER — Encounter: Payer: Self-pay | Admitting: Rehabilitative and Restorative Service Providers"

## 2015-04-20 DIAGNOSIS — M2560 Stiffness of unspecified joint, not elsewhere classified: Secondary | ICD-10-CM | POA: Diagnosis not present

## 2015-04-20 DIAGNOSIS — R293 Abnormal posture: Secondary | ICD-10-CM

## 2015-04-20 DIAGNOSIS — R29898 Other symptoms and signs involving the musculoskeletal system: Secondary | ICD-10-CM

## 2015-04-20 DIAGNOSIS — Z7409 Other reduced mobility: Secondary | ICD-10-CM

## 2015-04-20 DIAGNOSIS — M25512 Pain in left shoulder: Secondary | ICD-10-CM

## 2015-04-20 DIAGNOSIS — R531 Weakness: Secondary | ICD-10-CM
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
## 2015-04-20 NOTE — Patient Instructions (Signed)
Massage with ball  About 4 in ball  [facility]    Work on standing straight and tall lifting chest and pulling shoudler blades down and back.  Axial Extension (Chin Tuck)    Pull chin in and lengthen back of neck. Hold ____ seconds while counting out loud. Repeat ____ times. Do ____ sessions per day.    Shoulder Blade Squeeze    Rotate shoulders back, then squeeze shoulder blades down and back. Hold 10 sec. Repeat __10__ times. Do _several__ sessions per day.     Flexors Stretch, Standing    Stand about a thigh's length from table. Place hands on table. Step back until stretch is felt in shoulder. Hold _10__ seconds. Repeat _10__ times per session. Do _3-4__ sessions per day.    ROM: Lateral Glide    With left arm resting comfortably on table, slide out until you feel a good stretch in shoulder. Hold __10__ seconds.  Repeat __10__ times per set. Do ___3-4_ sessions per day.    Stand and bring hand behind back, using other hand to assist. Hold _20-30__ seconds. Repeat _3__ times per session. Do _3-4__ sessions per day.

## 2015-04-23 ENCOUNTER — Ambulatory Visit (INDEPENDENT_AMBULATORY_CARE_PROVIDER_SITE_OTHER): Payer: Worker's Compensation | Admitting: Physical Therapy

## 2015-04-23 ENCOUNTER — Encounter: Payer: Self-pay | Admitting: Physical Therapy

## 2015-04-23 DIAGNOSIS — M25512 Pain in left shoulder: Secondary | ICD-10-CM

## 2015-04-23 DIAGNOSIS — M2560 Stiffness of unspecified joint, not elsewhere classified: Secondary | ICD-10-CM | POA: Diagnosis not present

## 2015-04-23 DIAGNOSIS — R29898 Other symptoms and signs involving the musculoskeletal system: Secondary | ICD-10-CM | POA: Diagnosis not present

## 2015-04-23 DIAGNOSIS — R293 Abnormal posture: Secondary | ICD-10-CM

## 2015-04-23 DIAGNOSIS — Z7409 Other reduced mobility: Secondary | ICD-10-CM

## 2015-04-23 DIAGNOSIS — R531 Weakness: Secondary | ICD-10-CM

## 2015-04-23 NOTE — Therapy (Signed)
[facility] [place] [address] [place], [place], [address] Phone: [phone]   Fax:  [phone]  Physical Therapy Treatment  Patient Details  Name: Stacey Woodard MRN: [id number] Date of Birth: 03-Aug-1945 Referring Provider: Dr. Benjamin Stain  Encounter Date: 04/23/2015      PT End of Session - 04/23/15 0809    Visit Number 2   Number of Visits 12   Date for PT Re-Evaluation 06/01/15   PT Start Time 0807   PT Stop Time 0858   PT Time Calculation (min) 51 min   Activity Tolerance Patient tolerated treatment well      Past Medical History  Diagnosis Date  . Seizures (HCC)   . Frequency of urination     Past Surgical History  Procedure Laterality Date  . Knee arthroscopy    . Cesarean sections      x3    There were no vitals filed for this visit.  Visit Diagnosis:  Pain in shoulder region, left  Weakness of shoulder  Abnormal posture  Stiffness in joint  Decreased strength, endurance, and mobility      Subjective Assessment - 04/23/15 0810    Subjective Pt reports she is ok, injections made it a little better from last Friday. Doing her HEP, using a ball, can't find a pool noodle so she is using a pipe   Currently in Pain? Yes   Pain Score 3    Pain Location Arm   Pain Orientation Left;Posterior   Pain Descriptors / Indicators Aching   Pain Onset More than a month ago   Pain Frequency Intermittent                         [facility] Adult PT Treatment/Exercise - 04/23/15 0001    Shoulder Exercises: Supine   Horizontal ABduction Strengthening;Right;20 reps;Theraband   Theraband Level (Shoulder Horizontal ABduction) Level 2 (Red)   External Rotation Strengthening;Both;20 reps;Theraband   Theraband Level (Shoulder External Rotation) Level 2 (Red)   Flexion Strengthening;Both;20 reps;Theraband   Theraband Level (Shoulder Flexion) Level 2 (Red)   Other Supine Exercises overhead pull with red  band x 20 reps   Shoulder Exercises: Pulleys   Flexion 1 minute   ABduction 1 minute  scaption   Shoulder Exercises: ROM/Strengthening   UBE (Upper Arm Bike) L2x4' alt FWD/BWD   Shoulder Exercises: Stretch   Other Shoulder Stretches doorway stretch arms low and mid 2x30sec   Modalities   Modalities Electrical Stimulation;Vasopneumatic   Electrical Stimulation   Electrical Stimulation Location Lt shoulder   Electrical Stimulation Action IFC   Electrical Stimulation Parameters to tolerance   Electrical Stimulation Goals Pain   Vasopneumatic   Number Minutes Vasopneumatic  15 minutes   Vasopnuematic Location  Shoulder  Lt   Vasopneumatic Pressure Medium   Vasopneumatic Temperature  3*                     PT Long Term Goals - 04/20/15 0805    PT LONG TERM GOAL #1   Title 5/5 pain free strength Lt shoudler 06/01/15   Time 6   Period Weeks   Status New   PT LONG TERM GOAL #2   Title Improve postrue and alignment 06/01/15   Time 6   Period Weeks   Status New   PT LONG TERM GOAL #3   Title Patinet reports that she can reach into overhead cabilnet for items with minimal to no  pain 06/01/15   Time 6   Period Weeks   Status New   PT LONG TERM GOAL #4   Title Increase Lt shoulder ROM to equal or greater than Rt shoudler ROM 06/01/15   Time 6   Period Weeks   Status New   PT LONG TERM GOAL #5   Title Improve FOTO to </= 37% limitation 06/01/15   Time 6   Period Weeks   Status New               Plan - 04/23/15 0818    Clinical Impression Statement This is patients second visit, she has increased Lt shoulder ROM with decreased pain.  She is working hard on improving her posture.    Pt will benefit from skilled therapeutic intervention in order to improve on the following deficits Postural dysfunction;Improper body mechanics;Decreased range of motion;Decreased mobility;Decreased strength;Decreased endurance;Decreased activity tolerance;Pain   Rehab  Potential Good   PT Frequency 2x / week   PT Duration 6 weeks   PT Treatment/Interventions Patient/family education;ADLs/Self Care Home Management;Neuromuscular re-education;Dry needling;Therapeutic activities;Therapeutic exercise;Cryotherapy;Electrical Stimulation;Iontophoresis 4mg /ml Dexamethasone;Moist Heat;Ultrasound;Manual techniques   PT Next Visit Plan progress HEP strengthening   Consulted and Agree with Plan of Care Patient        Problem List Patient Active Problem List   Diagnosis Date Noted  . Subacromial bursitis left 12/22/2014  . Osteoarthritis of left acromioclavicular joint 01/17/2014  . SEIZURE DISORDER 11/13/2009    Roderic ScarceSusan Honor Frison PT 04/23/2015, 8:44 AM  [facility] [address] [place], [place], [address] Phone: [phone]   Fax:  [phone]  Name: Stacey Woodard MRN: [id number] Date of Birth: 11/12/1945

## 2015-04-27 ENCOUNTER — Ambulatory Visit (INDEPENDENT_AMBULATORY_CARE_PROVIDER_SITE_OTHER): Payer: Worker's Compensation | Admitting: Rehabilitative and Restorative Service Providers"

## 2015-04-27 DIAGNOSIS — R531 Weakness: Secondary | ICD-10-CM

## 2015-04-27 DIAGNOSIS — R6889 Other general symptoms and signs: Secondary | ICD-10-CM

## 2015-04-27 DIAGNOSIS — Z7409 Other reduced mobility: Secondary | ICD-10-CM

## 2015-04-27 DIAGNOSIS — M2560 Stiffness of unspecified joint, not elsewhere classified: Secondary | ICD-10-CM | POA: Diagnosis not present

## 2015-04-27 DIAGNOSIS — R29898 Other symptoms and signs involving the musculoskeletal system: Secondary | ICD-10-CM | POA: Diagnosis not present

## 2015-04-27 DIAGNOSIS — M25512 Pain in left shoulder: Secondary | ICD-10-CM | POA: Diagnosis not present

## 2015-04-27 DIAGNOSIS — R293 Abnormal posture: Secondary | ICD-10-CM

## 2015-04-27 NOTE — Patient Instructions (Signed)
Internal Rotation    Stand with hands clasped behind back or you can use a strap to pull hand up back. Pull elbows back as far as possible. Hold __30__ seconds. Repeat __3__ times. Do __2-3__ sessions per day.  Resisted External Rotation: in Neutral - Bilateral   PALMS UP Sit or stand, tubing in both hands, elbows at sides, bent to 90, forearms forward. Pinch shoulder blades together and rotate forearms out. Keep elbows at sides. Repeat __10__ times per set. Do _2-3___ sets per session. Do _2-3___ sessions per day.   Low Row: Standing   Face anchor, feet shoulder width apart. Palms up, pull arms back, squeezing shoulder blades together. Repeat 10__ times per set. Do 2-3__ sets per session. Do 2-3__ sessions per week. Anchor Height: Waist     Strengthening: Resisted Extension   Hold tubing in right hand, arm forward. Pull arm back, elbow straight. Repeat _10___ times per set. Do 2-3____ sets per session. Do 2-3____ sessions per day.

## 2015-04-27 NOTE — Therapy (Signed)
[facility] [address] [place] [place] [address] [place], [place], [address] Phone: [phone]   Fax:  [phone]  Physical Therapy Treatment  Patient Details  Name: Stacey Woodard MRN: [id number] Date of Birth: 03/20/1946 Referring Provider: Dr. Benjamin Stainhekkekandam  Encounter Date: 04/27/2015      PT End of Session - 04/27/15 0803    Visit Number 3   Number of Visits 12   Date for PT Re-Evaluation 06/01/15   PT Start Time 0804   PT Stop Time 0850   PT Time Calculation (min) 46 min   Activity Tolerance Patient tolerated treatment well      Past Medical History  Diagnosis Date  . Seizures (HCC)   . Frequency of urination     Past Surgical History  Procedure Laterality Date  . Knee arthroscopy    . Cesarean sections      x3    There were no vitals filed for this visit.  Visit Diagnosis:  Pain in shoulder region, left  Weakness of shoulder  Abnormal posture  Stiffness in joint  Decreased strength, endurance, and mobility      Subjective Assessment - 04/27/15 0804    Subjective Pt reports that her shoulder is feeling better some during the day but still has the morning "acheys". She is woring on her exercises at home.    Currently in Pain? Yes   Pain Score 3    Pain Location Shoulder   Pain Orientation Left;Anterior   Pain Descriptors / Indicators Aching   Pain Type Chronic pain   Pain Onset More than a month ago   Pain Frequency Intermittent                         [facility] Adult PT Treatment/Exercise - 04/27/15 0001    Shoulder Exercises: Supine   Horizontal ABduction Strengthening;Right;20 reps;Theraband   Theraband Level (Shoulder Horizontal ABduction) Level 2 (Red)   External Rotation Strengthening;Both;20 reps;Theraband   Theraband Level (Shoulder External Rotation) Level 2 (Red)   Flexion Strengthening;Both;20 reps;Theraband   Theraband Level (Shoulder Flexion) Level 2 (Red)   Shoulder Exercises:  Standing   Extension Strengthening;Both;10 reps;Theraband   Theraband Level (Shoulder Extension) Level 2 (Red)   Row Strengthening;Both;10 reps;Theraband   Theraband Level (Shoulder Row) Level 2 (Red)   Retraction 10 reps  with noodle   Shoulder Exercises: Pulleys   Flexion --  10 sec x 10   ABduction --  10 sesc x 10 scaption    Shoulder Exercises: ROM/Strengthening   UBE (Upper Arm Bike) L2x4' alt FWD/BWD   Shoulder Exercises: Stretch   Corner Stretch Limitations doorway 3 postions; 30 sec x3 ea   Internal Rotation Stretch 30 seconds   Modalities   Modalities Cryotherapy;Museum/gallery exhibitions officerlectrical Stimulation   Electrical Stimulation   Electrical Stimulation Location Lt shoulder   Electrical Stimulation Action IFC   Electrical Stimulation Parameters to tolerance   Electrical Stimulation Goals Pain   Vasopneumatic   Number Minutes Vasopneumatic  15 minutes   Vasopnuematic Location  Shoulder  Lt   Vasopneumatic Pressure Medium   Vasopneumatic Temperature  3*   Manual Therapy   Manual therapy comments pt supine   Joint Mobilization GH mobs Lt    Scapular Mobilization Lt   Passive ROM Lt shoulder all planes                 PT Education - 04/27/15 0841    Education provided Yes   Education Details postural  correctioin; HEP   Person(s) Educated Patient   Methods Explanation;Demonstration;Tactile cues;Verbal cues;Handout   Comprehension Returned demonstration;Verbal cues required;Tactile cues required             PT Long Term Goals - 04/20/15 0805    PT LONG TERM GOAL #1   Title 5/5 pain free strength Lt shoudler 06/01/15   Time 6   Period Weeks   Status New   PT LONG TERM GOAL #2   Title Improve postrue and alignment 06/01/15   Time 6   Period Weeks   Status New   PT LONG TERM GOAL #3   Title Patinet reports that she can reach into overhead cabilnet for items with minimal to no pain 06/01/15   Time 6   Period Weeks   Status New   PT LONG TERM GOAL #4   Title  Increase Lt shoulder ROM to equal or greater than Rt shoudler ROM 06/01/15   Time 6   Period Weeks   Status New   PT LONG TERM GOAL #5   Title Improve FOTO to </= 37% limitation 06/01/15   Time 6   Period Weeks   Status New               Plan - 04/27/15 2841    Clinical Impression Statement Good improvement in pain and increase in shoulder ROM. Pt now sleeping better with less pain at night. Pain is now intermittent. Progressing well toward stated goals of therapy.    Pt will benefit from skilled therapeutic intervention in order to improve on the following deficits Postural dysfunction;Improper body mechanics;Decreased range of motion;Decreased mobility;Decreased strength;Decreased endurance;Decreased activity tolerance;Pain   Rehab Potential Good   PT Frequency 2x / week   PT Duration 6 weeks   PT Treatment/Interventions Patient/family education;ADLs/Self Care Home Management;Neuromuscular re-education;Dry needling;Therapeutic activities;Therapeutic exercise;Cryotherapy;Electrical Stimulation;Iontophoresis /ml Dexamethasone;Moist Heat;Ultrasound;Manual techniques   PT Next Visit Plan progress HEP strengthening   PT Home Exercise Plan postural work; ball release; HEP   Consulted and Agree with Plan of Care Patient        Problem List Patient Active Problem List   Diagnosis Date Noted  . Subacromial bursitis left 12/22/2014  . Osteoarthritis of left acromioclavicular joint 01/17/2014  . SEIZURE DISORDER 11/13/2009    Romonda Parker Rober Minion PT, MPH 04/27/2015, 8:46 AM  [facility] [address] [place], [place], [address] Phone: [phone]   Fax:  [phone]  Name: Stacey Woodard MRN: [id number] Date of Birth: 1946/04/04

## 2015-04-30 ENCOUNTER — Encounter: Payer: Self-pay | Admitting: Rehabilitative and Restorative Service Providers"

## 2015-04-30 ENCOUNTER — Ambulatory Visit (INDEPENDENT_AMBULATORY_CARE_PROVIDER_SITE_OTHER): Payer: Worker's Compensation | Admitting: Rehabilitative and Restorative Service Providers"

## 2015-04-30 DIAGNOSIS — M25512 Pain in left shoulder: Secondary | ICD-10-CM | POA: Diagnosis not present

## 2015-04-30 DIAGNOSIS — M2560 Stiffness of unspecified joint, not elsewhere classified: Secondary | ICD-10-CM

## 2015-04-30 DIAGNOSIS — R29898 Other symptoms and signs involving the musculoskeletal system: Secondary | ICD-10-CM

## 2015-04-30 DIAGNOSIS — R293 Abnormal posture: Secondary | ICD-10-CM | POA: Diagnosis not present

## 2015-04-30 DIAGNOSIS — Z7409 Other reduced mobility: Secondary | ICD-10-CM

## 2015-04-30 DIAGNOSIS — R531 Weakness: Secondary | ICD-10-CM

## 2015-04-30 DIAGNOSIS — R6889 Other general symptoms and signs: Secondary | ICD-10-CM

## 2015-04-30 NOTE — Therapy (Signed)
[facility] [address] [place] [address] [place], [place], [address] Phone: [phone]   Fax:  [phone]  Physical Therapy Treatment  Patient Details  Name: Stacey Woodard MRN: [id number] Date of Birth: 04/07/1946 Referring Provider: Dr. Benjamin Stainhekkekandam  Encounter Date: 04/30/2015      PT End of Session - 04/30/15 0803    Visit Number 4   Number of Visits 12   Date for PT Re-Evaluation 06/01/15   PT Start Time 0803      Past Medical History  Diagnosis Date  . Seizures (HCC)   . Frequency of urination     Past Surgical History  Procedure Laterality Date  . Knee arthroscopy    . Cesarean sections      x3    There were no vitals filed for this visit.  Visit Diagnosis:  Pain in shoulder region, left  Weakness of shoulder  Abnormal posture  Stiffness in joint  Decreased strength, endurance, and mobility      Subjective Assessment - 04/30/15 0803    Subjective Patient reports that she was moving summer and winter clothes around yesterday and could feel it in her shoulder. She has some aching this am. She reports tha she is doing her exercises at home at least 2 x/day.   Currently in Pain? Yes   Pain Score 2    Pain Location Shoulder   Pain Orientation Left;Anterior   Pain Descriptors / Indicators Aching                         [facility] Adult PT Treatment/Exercise - 04/30/15 0001    Shoulder Exercises: Standing   External Rotation Strengthening;Both;20 reps;Theraband   Theraband Level (Shoulder External Rotation) Level 1 (Yellow)  Lt shd pain with Red TB   Extension Strengthening;Both;10 reps;Theraband   Theraband Level (Shoulder Extension) Level 2 (Red)   Row Strengthening;Both;10 reps;Theraband   Theraband Level (Shoulder Row) Level 2 (Red)   Retraction 10 reps  with noodle   Shoulder Exercises: Pulleys   Flexion --  10 sec x 10   ABduction --  10 sesc x 10 scaption    Shoulder Exercises:  Therapy Ball   Other Therapy Ball Exercises ball on wall multiple directions 1 min x 2   Shoulder Exercises: ROM/Strengthening   UBE (Upper Arm Bike) L3x4' alt FWD/BWD   Shoulder Exercises: Stretch   Corner Stretch Limitations doorway 3 postions; 30 sec x3 ea   Internal Rotation Stretch 30 seconds   Modalities   Modalities Cryotherapy;Museum/gallery exhibitions officerlectrical Stimulation   Electrical Stimulation   Electrical Stimulation Location Lt shoulder   Electrical Stimulation Action IFC   Electrical Stimulation Parameters to tolerance   Electrical Stimulation Goals Pain   Vasopneumatic   Number Minutes Vasopneumatic  15 minutes   Vasopnuematic Location  Shoulder  Lt   Vasopneumatic Pressure Medium   Vasopneumatic Temperature  3*   Manual Therapy   Manual therapy comments pt supine   Joint Mobilization GH mobs Lt    Scapular Mobilization Lt   Passive ROM Lt shoulder all planes                      PT Long Term Goals - 04/30/15 0808    PT LONG TERM GOAL #1   Title 5/5 pain free strength Lt shoudler 06/01/15   Time 6   Period Weeks   Status On-going   PT LONG TERM GOAL #2   Title Improve  postrue and alignment 06/01/15   Time 6   Period Weeks   Status On-going   PT LONG TERM GOAL #3   Title Patinet reports that she can reach into overhead cabilnet for items with minimal to no pain 06/01/15   Time 6   Period Weeks   Status On-going   PT LONG TERM GOAL #4   Title Increase Lt shoulder ROM to equal or greater than Rt shoudler ROM 06/01/15   Time 6   Period Weeks   Status On-going   PT LONG TERM GOAL #5   Title Improve FOTO to </= 37% limitation 06/01/15   Time 6   Status On-going               Plan - 04/30/15 0806    Clinical Impression Statement Continued aching in the Lt shoulder but tolerating exercises well. She is sleeping better and pain continues to improve. Shoulder is not throbbing like it was.Patient continues to have difficulty with posterior shoulder girdle  contraction and postural control with exercise. Will benefit from continued neuromuscular reeducation and strengthening. Conitnued progress toward goals of therapy.    Pt will benefit from skilled therapeutic intervention in order to improve on the following deficits Postural dysfunction;Improper body mechanics;Decreased range of motion;Decreased mobility;Decreased strength;Decreased endurance;Decreased activity tolerance;Pain   Rehab Potential Good   PT Frequency 2x / week   PT Duration 6 weeks   PT Treatment/Interventions Patient/family education;ADLs/Self Care Home Management;Neuromuscular re-education;Dry needling;Therapeutic activities;Therapeutic exercise;Cryotherapy;Electrical Stimulation;Iontophoresis /ml Dexamethasone;Moist Heat;Ultrasound;Manual techniques   PT Next Visit Plan progress HEP; strengthening   PT Home Exercise Plan postural work; ball release; HEP   Consulted and Agree with Plan of Care Patient        Problem List Patient Active Problem List   Diagnosis Date Noted  . Subacromial bursitis left 12/22/2014  . Osteoarthritis of left acromioclavicular joint 01/17/2014  . SEIZURE DISORDER 11/13/2009    Adie Vilar Rober Minion PT, MPH 04/30/2015, 8:39 AM  [facility] [address] [address] [place], [place], [address] Phone: [phone]   Fax:  [phone]  Name: Stacey Woodard MRN: [id number] Date of Birth: Nov 20, 1945

## 2015-05-04 ENCOUNTER — Encounter: Payer: Self-pay | Admitting: Physical Therapy

## 2015-05-05 ENCOUNTER — Ambulatory Visit (INDEPENDENT_AMBULATORY_CARE_PROVIDER_SITE_OTHER): Payer: Worker's Compensation | Admitting: Rehabilitative and Restorative Service Providers"

## 2015-05-05 ENCOUNTER — Encounter: Payer: Self-pay | Admitting: Rehabilitative and Restorative Service Providers"

## 2015-05-05 DIAGNOSIS — M25512 Pain in left shoulder: Secondary | ICD-10-CM

## 2015-05-05 DIAGNOSIS — R29898 Other symptoms and signs involving the musculoskeletal system: Secondary | ICD-10-CM | POA: Diagnosis not present

## 2015-05-05 DIAGNOSIS — R531 Weakness: Secondary | ICD-10-CM

## 2015-05-05 DIAGNOSIS — R293 Abnormal posture: Secondary | ICD-10-CM

## 2015-05-05 DIAGNOSIS — M2560 Stiffness of unspecified joint, not elsewhere classified: Secondary | ICD-10-CM

## 2015-05-05 DIAGNOSIS — R6889 Other general symptoms and signs: Secondary | ICD-10-CM

## 2015-05-05 DIAGNOSIS — Z7409 Other reduced mobility: Secondary | ICD-10-CM

## 2015-05-05 NOTE — Therapy (Signed)
[facility] [address] [place] [address] [place], [place], [address] Phone: [phone]   Fax:  [phone]  Physical Therapy Treatment  Patient Details  Name: Stacey Woodard MRN: [id number] Date of Birth: 01/06/1946 Referring Provider: Dr Benjamin Stainhekkekandam  Encounter Date: 05/05/2015      PT End of Session - 05/05/15 1016    Visit Number 5   Number of Visits 12   Date for PT Re-Evaluation 06/01/15   PT Start Time 1016   PT Stop Time 1107   PT Time Calculation (min) 51 min   Activity Tolerance Patient tolerated treatment well      Past Medical History  Diagnosis Date  . Seizures (HCC)   . Frequency of urination     Past Surgical History  Procedure Laterality Date  . Knee arthroscopy    . Cesarean sections      x3    There were no vitals filed for this visit.  Visit Diagnosis:  Pain in shoulder region, left  Weakness of shoulder  Abnormal posture  Stiffness in joint  Decreased strength, endurance, and mobility      Subjective Assessment - 05/05/15 1018    Subjective Out of town for son's wedding - no trouble with shoulder with travel and staying in a different place. Shoulder is improving. Progressing well with rehab.    Currently in Pain? Yes   Pain Score 2    Pain Location Shoulder   Pain Orientation Left;Proximal   Pain Descriptors / Indicators Aching   Pain Type Chronic pain            [facility] PT Assessment - 05/05/15 0001    Assessment   Medical Diagnosis Lt AC arthritis/subacrominal bursitis   Referring Provider Dr Benjamin Stainhekkekandam   Onset Date/Surgical Date 01/07/14   Hand Dominance Right   Next MD Visit 12/16   Prior Therapy none   AROM   Left Shoulder Extension 51 Degrees   Left Shoulder Flexion 125 Degrees   Left Shoulder ABduction 119 Degrees                     [facility] Adult PT Treatment/Exercise - 05/05/15 0001    Shoulder Exercises: Standing   External Rotation Strengthening;Left;10  reps;Theraband   Theraband Level (Shoulder External Rotation) Level 1 (Yellow)   Internal Rotation Strengthening;Left;10 reps;Theraband   Theraband Level (Shoulder Internal Rotation) Level 1 (Yellow)   Extension Strengthening;Both;Theraband;20 reps   Theraband Level (Shoulder Extension) Level 2 (Red)   Row Strengthening;Both;Theraband;20 reps   Theraband Level (Shoulder Row) Level 2 (Red)   Retraction 10 reps  with noodle   Theraband Level (Shoulder Retraction) Level 1 (Yellow)  bilat UE's with scap squeeze w/ noodle    Shoulder Exercises: Pulleys   Flexion --  10 sec x 10   ABduction --  10 sesc x 10 scaption    Shoulder Exercises: Therapy Ball   Other Therapy Ball Exercises ball on wall multiple directions 1 min x 2   Shoulder Exercises: ROM/Strengthening   UBE (Upper Arm Bike) L4 x4' alt FWD/BWD   Shoulder Exercises: Stretch   Corner Stretch Limitations doorway 3 postions; 30 sec x3 ea   Internal Rotation Stretch 30 seconds   Other Shoulder Stretches lateral cervical flexion 20 sec x3    Modalities   Modalities Cryotherapy;Museum/gallery exhibitions officerlectrical Stimulation   Electrical Stimulation   Electrical Stimulation Location Lt shoulder   Electrical Stimulation Action IFC   Electrical Stimulation Parameters to tolerance   Electrical Stimulation  Goals Pain   Vasopneumatic   Number Minutes Vasopneumatic  15 minutes   Vasopnuematic Location  Shoulder   Vasopneumatic Pressure Medium   Vasopneumatic Temperature  3*   Manual Therapy   Manual therapy comments pt sitting    Joint Mobilization GH mob   Soft tissue mobilization Lt upper trap/cervical    Passive ROM Lt shoulder all planes                 PT Education - 05/05/15 1100    Education provided Yes   Education Details posture; HEP   Person(s) Educated Patient   Methods Explanation;Demonstration;Tactile cues;Verbal cues;Handout   Comprehension Verbalized understanding;Returned demonstration;Verbal cues required;Tactile cues  required             PT Long Term Goals - 04/30/15 0808    PT LONG TERM GOAL #1   Title 5/5 pain free strength Lt shoudler 06/01/15   Time 6   Period Weeks   Status On-going   PT LONG TERM GOAL #2   Title Improve postrue and alignment 06/01/15   Time 6   Period Weeks   Status On-going   PT LONG TERM GOAL #3   Title Patinet reports that she can reach into overhead cabilnet for items with minimal to no pain 06/01/15   Time 6   Period Weeks   Status On-going   PT LONG TERM GOAL #4   Title Increase Lt shoulder ROM to equal or greater than Rt shoudler ROM 06/01/15   Time 6   Period Weeks   Status On-going   PT LONG TERM GOAL #5   Title Improve FOTO to </= 37% limitation 06/01/15   Time 6   Status On-going               Plan - 05/05/15 1107    Clinical Impression Statement [place] continues to improve. she has tightness in the upper trap toward neck area on Lt. no new HEP today - patient not consistent with current exercises. Will progress as indicated with HEP. Note gains in AROM Lt shoulder. Tightness upper trap/leveator Lt. Progressing well toward stated goals of therapy.    Pt will benefit from skilled therapeutic intervention in order to improve on the following deficits Postural dysfunction;Improper body mechanics;Decreased range of motion;Decreased mobility;Decreased strength;Decreased endurance;Decreased activity tolerance;Pain   Rehab Potential Good   PT Frequency 2x / week   PT Duration 6 weeks   PT Treatment/Interventions Patient/family education;ADLs/Self Care Home Management;Neuromuscular re-education;Dry needling;Therapeutic activities;Therapeutic exercise;Cryotherapy;Electrical Stimulation;Iontophoresis /ml Dexamethasone;Moist Heat;Ultrasound;Manual techniques   PT Next Visit Plan progress HEP; strengthening   PT Home Exercise Plan postural work; ball release; HEP   Consulted and Agree with Plan of Care Patient        Problem List Patient Active  Problem List   Diagnosis Date Noted  . Subacromial bursitis left 12/22/2014  . Osteoarthritis of left acromioclavicular joint 01/17/2014  . SEIZURE DISORDER 11/13/2009    Yobani Schertzer Rober Minion PT, MPH 05/05/2015, 11:13 AM  [facility] [address] [place] [address] [place], [place], [address] Phone: [phone]   Fax:  [phone]  Name: Stacey Woodard MRN: [id number] Date of Birth: 1945-11-26

## 2015-05-05 NOTE — Patient Instructions (Signed)
AROM: Lateral Neck Flexion   Keep chin tucked back and sit up straight/chest up and shoulders down and back. Slowly tilt head toward one shoulder, then the other. Hold each position __20__ seconds. Repeat _3_ times per set. Do _several___ sessions per day.

## 2015-05-11 ENCOUNTER — Ambulatory Visit (INDEPENDENT_AMBULATORY_CARE_PROVIDER_SITE_OTHER): Payer: Worker's Compensation | Admitting: Physical Therapy

## 2015-05-11 DIAGNOSIS — R29898 Other symptoms and signs involving the musculoskeletal system: Secondary | ICD-10-CM

## 2015-05-11 DIAGNOSIS — R293 Abnormal posture: Secondary | ICD-10-CM

## 2015-05-11 DIAGNOSIS — M2560 Stiffness of unspecified joint, not elsewhere classified: Secondary | ICD-10-CM | POA: Diagnosis not present

## 2015-05-11 DIAGNOSIS — Z7409 Other reduced mobility: Secondary | ICD-10-CM

## 2015-05-11 DIAGNOSIS — M25512 Pain in left shoulder: Secondary | ICD-10-CM

## 2015-05-11 DIAGNOSIS — R531 Weakness: Secondary | ICD-10-CM

## 2015-05-11 DIAGNOSIS — R6889 Other general symptoms and signs: Secondary | ICD-10-CM

## 2015-05-11 NOTE — Therapy (Signed)
[facility] [place] [address] [place] [address] [place], [place], [address] Phone: [phone]   Fax:  [phone]  Physical Therapy Treatment  Patient Details  Name: Stacey Woodard MRN: [id number] Date of Birth: 1945-11-16 Referring Provider: Dr Benjamin Stain  Encounter Date: 05/11/2015      PT End of Session - 05/11/15 0807    Visit Number 6   Number of Visits 12   Date for PT Re-Evaluation 06/01/15   PT Start Time 0802   PT Stop Time 0857   PT Time Calculation (min) 55 min   Activity Tolerance Patient tolerated treatment well      Past Medical History  Diagnosis Date  . Seizures (HCC)   . Frequency of urination     Past Surgical History  Procedure Laterality Date  . Knee arthroscopy    . Cesarean sections      x3    There were no vitals filed for this visit.  Visit Diagnosis:  Pain in shoulder region, left  Weakness of shoulder  Abnormal posture  Stiffness in joint  Decreased strength, endurance, and mobility      Subjective Assessment - 05/11/15 0807    Subjective Pt reports there have been no new changes.     Currently in Pain? Yes   Pain Score 1    Pain Location Shoulder   Pain Orientation Left;Anterior   Pain Descriptors / Indicators Aching   Aggravating Factors  raising arm, reaching    Pain Relieving Factors ice            [facility] PT Assessment - 05/11/15 0001    Assessment   Medical Diagnosis Lt AC arthritis/subacrominal bursitis   Onset Date/Surgical Date 01/07/14   Hand Dominance Right   Next MD Visit 12/16   AROM   Left Shoulder Flexion 125 Degrees  standing   Left Shoulder ABduction 119 Degrees  standing   Cervical - Right Side Bend 40   Cervical - Left Side Bend 35          [facility] Adult PT Treatment/Exercise - 05/11/15 0001    Shoulder Exercises: Standing   External Rotation Strengthening;Both;10 reps   Theraband Level (Shoulder External Rotation) Level 2 (Red)  with scap squeeze around  pool noodle.    Extension Strengthening;Both;20 reps   Theraband Level (Shoulder Extension) Level 2 (Red)   Row Strengthening;Both;20 reps  5 reps with green, stopped due to inc pain   Theraband Level (Shoulder Row) Level 2 (Red)   Other Standing Exercises Rings on hoop Lt to/from Rt at medium height to build endurance x 1 rep each way (11 rings)    Shoulder Exercises: Pulleys   Flexion --  10 sec x 10   ABduction --  10 sesc x 10 scaption    Shoulder Exercises: ROM/Strengthening   UBE (Upper Arm Bike) L3 x alt FWD/BWD   Shoulder Exercises: Stretch   Corner Stretch Limitations doorway 3 postions; 30 sec x3 ea   Internal Rotation Stretch 2 reps   Other Shoulder Stretches lateral cervical flexion 20 sec x3  with towel anchor over shoulder   Modalities   Modalities Vasopneumatic   Electrical Stimulation   Electrical Stimulation Location Lt shoulder   Electrical Stimulation Action  IFC   Electrical Stimulation Parameters to tolerance    Electrical Stimulation Goals Pain   Vasopneumatic   Number Minutes Vasopneumatic  15 minutes   Vasopnuematic Location  Shoulder   Vasopneumatic Pressure Medium   Vasopneumatic Temperature  3*  PT Long Term Goals - 04/30/15 0808    PT LONG TERM GOAL #1   Title 5/5 pain free strength Lt shoudler 06/01/15   Time 6   Period Weeks   Status On-going   PT LONG TERM GOAL #2   Title Improve postrue and alignment 06/01/15   Time 6   Period Weeks   Status On-going   PT LONG TERM GOAL #3   Title Patinet reports that she can reach into overhead cabilnet for items with minimal to no pain 06/01/15   Time 6   Period Weeks   Status On-going   PT LONG TERM GOAL #4   Title Increase Lt shoulder ROM to equal or greater than Rt shoudler ROM 06/01/15   Time 6   Period Weeks   Status On-going   PT LONG TERM GOAL #5   Title Improve FOTO to </= 37% limitation 06/01/15   Time 6   Status On-going               Plan - 05/11/15 0825     Clinical Impression Statement Pt demo improved neck ROM this visit, Lt shoulder AROM remains similar to previous visit. Pt tolerated increased resistance with shoulder ER, but not with rowing/ext. Progressing well towards established goals. Pt will benefit from continued PT intervention to maximize functional mobility.    Pt will benefit from skilled therapeutic intervention in order to improve on the following deficits Postural dysfunction;Improper body mechanics;Decreased range of motion;Decreased mobility;Decreased strength;Decreased endurance;Decreased activity tolerance;Pain   Rehab Potential Good   PT Frequency 2x / week   PT Duration 6 weeks   PT Treatment/Interventions Patient/family education;ADLs/Self Care Home Management;Neuromuscular re-education;Dry needling;Therapeutic activities;Therapeutic exercise;Cryotherapy;Electrical Stimulation;Iontophoresis 4mg /ml Dexamethasone;Moist Heat;Ultrasound;Manual techniques   PT Next Visit Plan progress HEP; strengthening   Consulted and Agree with Plan of Care Patient        Problem List Patient Active Problem List   Diagnosis Date Noted  . Subacromial bursitis left 12/22/2014  . Osteoarthritis of left acromioclavicular joint 01/17/2014  . SEIZURE DISORDER 11/13/2009   Mayer CamelJennifer Carlson-Long, PTA 05/11/2015 8:44 AM  [facility] [address] [place] [address] [place], [place], [address] Phone: [phone]   Fax:  [phone]  Name: Thane EduMartha Hirschhorn MRN: [id number] Date of Birth: 11/19/1945

## 2015-05-14 ENCOUNTER — Ambulatory Visit (INDEPENDENT_AMBULATORY_CARE_PROVIDER_SITE_OTHER): Payer: Worker's Compensation | Admitting: Rehabilitative and Restorative Service Providers"

## 2015-05-14 ENCOUNTER — Encounter: Payer: Self-pay | Admitting: Rehabilitative and Restorative Service Providers"

## 2015-05-14 DIAGNOSIS — R293 Abnormal posture: Secondary | ICD-10-CM | POA: Diagnosis not present

## 2015-05-14 DIAGNOSIS — R29898 Other symptoms and signs involving the musculoskeletal system: Secondary | ICD-10-CM

## 2015-05-14 DIAGNOSIS — R531 Weakness: Secondary | ICD-10-CM

## 2015-05-14 DIAGNOSIS — M25512 Pain in left shoulder: Secondary | ICD-10-CM | POA: Diagnosis not present

## 2015-05-14 DIAGNOSIS — M2560 Stiffness of unspecified joint, not elsewhere classified: Secondary | ICD-10-CM | POA: Diagnosis not present

## 2015-05-14 DIAGNOSIS — Z7409 Other reduced mobility: Secondary | ICD-10-CM

## 2015-05-14 NOTE — Therapy (Signed)
[facility] [place] [address] [place] [address] [place], [place], [address] Phone: [phone]   Fax:  [phone]  Physical Therapy Treatment  Patient Details  Name: Stacey Woodard MRN: [id number] Date of Birth: 11-Feb-1946 Referring Provider: Dr Benjamin Stain  Encounter Date: 05/14/2015      PT End of Session - 05/14/15 0805    Visit Number 7   Number of Visits 12   Date for PT Re-Evaluation 06/01/15   PT Start Time 0805   PT Stop Time 0855   PT Time Calculation (min) 50 min   Activity Tolerance Patient tolerated treatment well      Past Medical History  Diagnosis Date  . Seizures (HCC)   . Frequency of urination     Past Surgical History  Procedure Laterality Date  . Knee arthroscopy    . Cesarean sections      x3    There were no vitals filed for this visit.  Visit Diagnosis:  Pain in shoulder region, left  Weakness of shoulder  Abnormal posture  Stiffness in joint  Decreased strength, endurance, and mobility      Subjective Assessment - 05/14/15 0805    Subjective Changed her curtains in two rooms yesterday including climbing on and off ladder and reaching up to hang and adjust curtains. She also sewed curtains by hand to hem. It was a very busy day using her arms. Shoulder was aching last night and sore this morning but not too bad.    Currently in Pain? Yes   Pain Score 2    Pain Location Shoulder   Pain Orientation Left;Anterior   Pain Descriptors / Indicators Sore;Aching   Pain Type Chronic pain   Pain Onset More than a month ago   Aggravating Factors  reaching up; raising arm overhead    Pain Relieving Factors ice; meds                         [facility] Adult PT Treatment/Exercise - 05/14/15 0001    Shoulder Exercises: Standing   External Rotation Strengthening;Both;20 reps;Theraband   Theraband Level (Shoulder External Rotation) Level 2 (Red)   Extension Strengthening;Both;20 reps   Theraband  Level (Shoulder Extension) Level 3 (Green)   Row Strengthening;Both;20 reps   Theraband Level (Shoulder Row) Level 3 (Green)   Retraction Strengthening;Both;20 reps;Theraband   Theraband Level (Shoulder Retraction) Level 1 (Yellow)   Other Standing Exercises Rings on hoop Lt to/from Rt at medium height to build endurance x 1 rep each way (11 rings)    Other Standing Exercises reverse wall push ups 5 sec hold x 10    Shoulder Exercises: Therapy Ball   Flexion --  ball on wall stepping under ball 20 sec hold x 3    Other Therapy Ball Exercises ball on wall multiple directions 1 min x 2   Shoulder Exercises: ROM/Strengthening   UBE (Upper Arm Bike) L3 x alt FWD/BWD   Shoulder Exercises: Stretch   Corner Stretch Limitations doorway 3 postions; 30 sec x3 ea   Internal Rotation Stretch 2 reps   Other Shoulder Stretches lateral cervical flexion 20 sec x 3   Modalities   Modalities Electrical Stimulation;Vasopneumatic   Electrical Stimulation   Electrical Stimulation Location Lt shoulder   Electrical Stimulation Action IFC   Electrical Stimulation Parameters to tolerance    Electrical Stimulation Goals Pain   Vasopneumatic   Number Minutes Vasopneumatic  15 minutes   Vasopnuematic Location  Shoulder  Vasopneumatic Pressure Medium   Vasopneumatic Temperature  3*                PT Education - 05/14/15 0844    Education provided Yes   Education Details encouraged rest with functional activites especially overhead tasks; posture; HEP   Person(s) Educated Patient   Methods Explanation   Comprehension Verbalized understanding             PT Long Term Goals - 05/14/15 0847    PT LONG TERM GOAL #1   Title 5/5 pain free strength Lt shoudler 06/01/15   Time 6   Period Weeks   Status On-going   PT LONG TERM GOAL #2   Title Improve postrue and alignment 06/01/15   Time 6   Period Weeks   Status On-going   PT LONG TERM GOAL #3   Title Patinet reports that she can  reach into overhead cabilnet for items with minimal to no pain 06/01/15   Time 6   Period Weeks   Status Achieved   PT LONG TERM GOAL #4   Title Increase Lt shoulder ROM to equal or greater than Rt shoudler ROM 06/01/15   Time 6   Period Weeks   Status On-going   PT LONG TERM GOAL #5   Title Improve FOTO to </= 37% limitation 06/01/15   Time 6   Period Weeks   Status On-going               Plan - 05/14/15 0845    Clinical Impression Statement Increased activity at home with minimal increase in symptoms; progressing well with rehab; increased cervical ROM; progressing well toward stated goals of therapy.    Pt will benefit from skilled therapeutic intervention in order to improve on the following deficits Postural dysfunction;Improper body mechanics;Decreased range of motion;Decreased mobility;Decreased strength;Decreased endurance;Decreased activity tolerance;Pain   Rehab Potential Good   PT Frequency 2x / week   PT Duration 6 weeks   PT Treatment/Interventions Patient/family education;ADLs/Self Care Home Management;Neuromuscular re-education;Dry needling;Therapeutic activities;Therapeutic exercise;Cryotherapy;Electrical Stimulation;Iontophoresis 4mg /ml Dexamethasone;Moist Heat;Ultrasound;Manual techniques   PT Next Visit Plan progress HEP; strengthening   PT Home Exercise Plan postural work; ball release; HEP   Consulted and Agree with Plan of Care Patient        Problem List Patient Active Problem List   Diagnosis Date Noted  . Subacromial bursitis left 12/22/2014  . Osteoarthritis of left acromioclavicular joint 01/17/2014  . SEIZURE DISORDER 11/13/2009    Stacey Woodard PT, MPH 05/14/2015, 8:49 AM  [facility] [address] [address] [place], [place], [address] Phone: [phone]   Fax:  [phone]  Name: Stacey Woodard MRN: [id number] Date of Birth: 03/24/1946

## 2015-05-18 ENCOUNTER — Encounter: Payer: Self-pay | Admitting: Rehabilitative and Restorative Service Providers"

## 2015-05-18 ENCOUNTER — Ambulatory Visit (INDEPENDENT_AMBULATORY_CARE_PROVIDER_SITE_OTHER): Payer: Worker's Compensation | Admitting: Rehabilitative and Restorative Service Providers"

## 2015-05-18 DIAGNOSIS — R29898 Other symptoms and signs involving the musculoskeletal system: Secondary | ICD-10-CM

## 2015-05-18 DIAGNOSIS — R293 Abnormal posture: Secondary | ICD-10-CM | POA: Diagnosis not present

## 2015-05-18 DIAGNOSIS — R6889 Other general symptoms and signs: Secondary | ICD-10-CM

## 2015-05-18 DIAGNOSIS — R531 Weakness: Secondary | ICD-10-CM

## 2015-05-18 DIAGNOSIS — M2560 Stiffness of unspecified joint, not elsewhere classified: Secondary | ICD-10-CM | POA: Diagnosis not present

## 2015-05-18 DIAGNOSIS — Z7409 Other reduced mobility: Secondary | ICD-10-CM

## 2015-05-18 DIAGNOSIS — M25512 Pain in left shoulder: Secondary | ICD-10-CM | POA: Diagnosis not present

## 2015-05-18 NOTE — Patient Instructions (Signed)
Anterior Capsule Stretch, Sitting (Passive)    Standing with hand holding surface, left arm straight out behind, resting on back of chair or counter. Shift hips forward. Hold _30__ seconds.  Repeat _3__ times per session. Do _2-3__ sessions per day.

## 2015-05-18 NOTE — Therapy (Signed)
[facility] [place] [address] [place] [place] [address] [place], [place], [address] Phone: [phone]   Fax:  [phone]  Physical Therapy Treatment  Patient Details  Name: Stacey Woodard MRN: [id number] Date of Birth: [birth year] Referring Provider: Dr Karie Schwalbe  Encounter Date: 05/18/2015      PT End of Session - 05/18/15 0802    Visit Number 8   Number of Visits 12   Date for PT Re-Evaluation 06/01/15   PT Start Time 0803   Activity Tolerance Patient tolerated treatment well      Past Medical History  Diagnosis Date  . Seizures (HCC)   . Frequency of urination     Past Surgical History  Procedure Laterality Date  . Knee arthroscopy    . Cesarean sections      x3    There were no vitals filed for this visit.  Visit Diagnosis:  Pain in shoulder region, left  Weakness of shoulder  Abnormal posture  Stiffness in joint  Decreased strength, endurance, and mobility      Subjective Assessment - 05/18/15 0822    Subjective Continues to improve. Some intermittent pain or discomfort. She has noticed increaesd abilitiy to use UE. Still can't do everything at home - wants to paint doorfacing.             [facility] PT Assessment - 05/18/15 0001    Assessment   Medical Diagnosis Lt AC arthritis/subacrominal bursitis   Referring Provider Dr T   Onset Date/Surgical Date 01/07/14   Hand Dominance Right   Next MD Visit 12/16   AROM   Left Shoulder Flexion 135 Degrees  mild pain   Left Shoulder ABduction 131 Degrees   Left Shoulder Internal Rotation 35 Degrees   Left Shoulder External Rotation 74 Degrees                     [facility] Adult PT Treatment/Exercise - 05/18/15 0001    Shoulder Exercises: Standing   External Rotation Strengthening;Both;20 reps;Theraband   Theraband Level (Shoulder External Rotation) Level 3 (Green)   Internal Rotation Strengthening;Left;10 reps;Theraband   Theraband Level (Shoulder Internal Rotation) Level  2 (Red)   Extension Strengthening;Both;20 reps   Theraband Level (Shoulder Extension) Level 3 (Green)   Row Strengthening;Both;20 reps   Theraband Level (Shoulder Row) Level 3 (Green)   Retraction Strengthening;Both;20 reps;Theraband   Theraband Level (Shoulder Retraction) Level 1 (Yellow)   Other Standing Exercises Rings on hoop Lt to/from Rt at medium height to build endurance x 1 rep each way (11 rings)    Other Standing Exercises reverse wall push ups 5 sec hold x 10    Shoulder Exercises: Pulleys   Other Pulley Exercises IR 20 sec x 3    Shoulder Exercises: Therapy Ball   Flexion --  ball on wall stepping under ball 20 sec hold x 3    Other Therapy Ball Exercises ball on wall multiple directions 1 min x 2   Shoulder Exercises: ROM/Strengthening   UBE (Upper Arm Bike) L4 x alt FWD/BWD   Shoulder Exercises: Stretch   Corner Stretch Limitations doorway 3 postions; 30 sec x3 ea   Internal Rotation Stretch 2 reps   Other Shoulder Stretches lateral cervical flexion 20 sec x 3   Other Shoulder Stretches biceps stretch 30 sec x3   Modalities   Modalities Electrical Stimulation;Vasopneumatic   Programme researcher, broadcasting/film/video Location Lt shoulder   Electrical Stimulation Action IFC   Electrical Stimulation Parameters  to tolerance   Electrical Stimulation Goals Pain   Vasopneumatic   Number Minutes Vasopneumatic  15 minutes   Vasopnuematic Location  Shoulder   Vasopneumatic Pressure Medium   Vasopneumatic Temperature  3*   Manual Therapy   Manual therapy comments pt supine   Joint Mobilization GH mob   Soft tissue mobilization Lt pecs    Scapular Mobilization Lt   Passive ROM Lt shoulder all planes                 PT Education - 05/18/15 0856    Education provided Yes   Education Details HEP   Person(s) Educated Patient   Methods Explanation;Demonstration;Tactile cues;Verbal cues;Handout   Comprehension Verbalized understanding;Returned  demonstration;Verbal cues required;Tactile cues required             PT Long Term Goals - 05/18/15 0823    PT LONG TERM GOAL #1   Title 5/5 pain free strength Lt shoudler 06/01/15   Time 6   Period Weeks   Status On-going   PT LONG TERM GOAL #2   Title Improve postrue and alignment 06/01/15   Time 6   Period Weeks   Status On-going   PT LONG TERM GOAL #3   Title Patient reports that she can reach into overhead cabilnet for items with minimal to no pain 06/01/15   Time 6   Period Weeks   Status Achieved   PT LONG TERM GOAL #4   Title Increase Lt shoulder ROM to equal or greater than Rt shoudler ROM 06/01/15   Time 6   Period Weeks   Status On-going   PT LONG TERM GOAL #5   Title Improve FOTO to </= 37% limitation 06/01/15   Time 6   Period Weeks   Status On-going               Plan - 05/18/15 0803    Clinical Impression Statement Continued progress toward stated goals. Pt more active at home; suing arm for more functioinal activities.    Pt will benefit from skilled therapeutic intervention in order to improve on the following deficits Postural dysfunction;Improper body mechanics;Decreased range of motion;Decreased mobility;Decreased strength;Decreased endurance;Decreased activity tolerance;Pain   Rehab Potential Good   PT Frequency 2x / week   PT Duration 6 weeks   PT Treatment/Interventions Patient/family education;ADLs/Self Care Home Management;Neuromuscular re-education;Dry needling;Therapeutic activities;Therapeutic exercise;Cryotherapy;Electrical Stimulation;Iontophoresis 4mg /ml Dexamethasone;Moist Heat;Ultrasound;Manual techniques   PT Next Visit Plan progress HEP; strengthening   PT Home Exercise Plan postural work; ball release; HEP   Consulted and Agree with Plan of Care Patient        Problem List Patient Active Problem List   Diagnosis Date Noted  . Subacromial bursitis left 12/22/2014  . Osteoarthritis of left acromioclavicular joint  01/17/2014  . SEIZURE DISORDER 11/13/2009    Stacey Woodard Rober MinionP Euriah Matlack PT, MPH 05/18/2015, 8:58 AM  [facility] [address] [address] [place], [place], [address] Phone: [phone]   Fax:  [phone]  Name: [name] MRN: [id number] Date of Birth: 02/07/1946

## 2015-05-21 ENCOUNTER — Ambulatory Visit (INDEPENDENT_AMBULATORY_CARE_PROVIDER_SITE_OTHER): Payer: Worker's Compensation | Admitting: Rehabilitative and Restorative Service Providers"

## 2015-05-21 ENCOUNTER — Encounter: Payer: Self-pay | Admitting: Rehabilitative and Restorative Service Providers"

## 2015-05-21 DIAGNOSIS — R29898 Other symptoms and signs involving the musculoskeletal system: Secondary | ICD-10-CM | POA: Diagnosis not present

## 2015-05-21 DIAGNOSIS — M2560 Stiffness of unspecified joint, not elsewhere classified: Secondary | ICD-10-CM | POA: Diagnosis not present

## 2015-05-21 DIAGNOSIS — Z7409 Other reduced mobility: Secondary | ICD-10-CM

## 2015-05-21 DIAGNOSIS — M25512 Pain in left shoulder: Secondary | ICD-10-CM | POA: Diagnosis not present

## 2015-05-21 DIAGNOSIS — R293 Abnormal posture: Secondary | ICD-10-CM | POA: Diagnosis not present

## 2015-05-21 DIAGNOSIS — R531 Weakness: Secondary | ICD-10-CM

## 2015-05-21 NOTE — Therapy (Signed)
[facility] [address] [place], [place], [address] Phone: [phone]   Fax:  [phone]  Physical Therapy Treatment  Patient Details  Name: Stacey Woodard MRN: [id number] Date of Birth: 12/26/1945 Referring Provider: Dr Karie Schwalbe  Encounter Date: 05/21/2015      PT End of Session - 05/21/15 0804    Visit Number 10   Number of Visits 12   Date for PT Re-Evaluation 06/01/15   PT Start Time 0802   PT Stop Time 0854   PT Time Calculation (min) 52 min   Activity Tolerance Patient tolerated treatment well      Past Medical History  Diagnosis Date  . Seizures (HCC)   . Frequency of urination     Past Surgical History  Procedure Laterality Date  . Knee arthroscopy    . Cesarean sections      x3    There were no vitals filed for this visit.  Visit Diagnosis:  Pain in shoulder region, left  Weakness of shoulder  Abnormal posture  Stiffness in joint  Decreased strength, endurance, and mobility      Subjective Assessment - 05/21/15 0811    Subjective Feels goood today - increasing activity with minimal difficulty.    Currently in Pain? Yes   Pain Score 1    Pain Location Shoulder   Pain Orientation Left;Anterior   Pain Descriptors / Indicators Sore   Pain Type Chronic pain   Pain Radiating Towards none   Pain Onset More than a month ago   Pain Frequency Intermittent   Aggravating Factors  reaching up; overhead activities                          [facility] - 05/21/15 0001    Shoulder Exercises: Standing   External Rotation Strengthening;Both;20 reps;Theraband   Theraband Level (Shoulder External Rotation) Level 3 (Green)   Internal Rotation Strengthening;Left;10 reps;Theraband   Theraband Level (Shoulder Internal Rotation) Level 2 (Red)   Extension Strengthening;Both;20 reps   Theraband Level (Shoulder Extension) Level 3 (Green)   Row Strengthening;Both;20 reps   Theraband Level (Shoulder Row) Level 3 (Green)   Retraction Strengthening;Both;20 reps;Theraband   Theraband Level (Shoulder Retraction) Level 1 (Yellow)   Other Standing Exercises Rings on hoop Lt to/from Rt at medium height to build endurance x 1 rep each way (11 rings)    Other Standing Exercises reverse wall push ups 5 sec hold x 10    Shoulder Exercises: Pulleys   Other Pulley Exercises IR 20 sec x 3    Shoulder Exercises: Therapy Ball   Other Therapy Ball Exercises ball on wall multiple directions 1 min x 2   Shoulder Exercises: ROM/Strengthening   UBE (Upper Arm Bike) L4 x 5min alt FWD/BWD   Shoulder Exercises: Stretch   Corner Stretch Limitations doorway 3 postions; 30 sec x3 ea   Internal Rotation Stretch 2 reps   Other Shoulder Stretches lateral cervical flexion 20 sec x 3   Other Shoulder Stretches biceps stretch 30 sec x3   Shoulder Exercises: Body Blade   Flexion 30 seconds;2 reps   Modalities   Modalities Electrical Stimulation;Vasopneumatic   Programme researcher, broadcasting/film/videolectrical Stimulation   Electrical Stimulation Location Lt shoulder   Electrical Stimulation Action IFC   Electrical Stimulation Parameters to tolerance   Electrical Stimulation Goals Pain   Vasopneumatic   Number Minutes Vasopneumatic  15 minutes   Vasopnuematic Location  Shoulder   Vasopneumatic Pressure  Medium   Vasopneumatic Temperature  3*   Manual Therapy   Manual therapy comments pt supine   Joint Mobilization GH mob   Soft tissue mobilization Lt pecs    Scapular Mobilization Lt   Passive ROM Lt shoulder all planes                 PT Education - 05/21/15 0813    Education provided Yes   Education Details HEP   Person(s) Educated Patient   Methods Explanation   Comprehension Verbalized understanding             PT Long Term Goals - 05/18/15 0823    PT LONG TERM GOAL #1   Title 5/5 pain free strength Lt shoudler 06/01/15   Time 6   Period Weeks   Status On-going   PT LONG TERM GOAL #2    Title Improve postrue and alignment 06/01/15   Time 6   Period Weeks   Status On-going   PT LONG TERM GOAL #3   Title Patient reports that she can reach into overhead cabilnet for items with minimal to no pain 06/01/15   Time 6   Period Weeks   Status Achieved   PT LONG TERM GOAL #4   Title Increase Lt shoulder ROM to equal or greater than Rt shoudler ROM 06/01/15   Time 6   Period Weeks   Status On-going   PT LONG TERM GOAL #5   Title Improve FOTO to </= 37% limitation 06/01/15   Time 6   Period Weeks   Status On-going               Plan - 05/21/15 0809    Clinical Impression Statement Progressing well toward stated goals; increasing activity level at home; less pain and improved mobility and strength.    Pt will benefit from skilled therapeutic intervention in order to improve on the following deficits Postural dysfunction;Improper body mechanics;Decreased range of motion;Decreased mobility;Decreased strength;Decreased endurance;Decreased activity tolerance;Pain   Rehab Potential Good   PT Frequency 2x / week   PT Duration 6 weeks   PT Treatment/Interventions Patient/family education;ADLs/Self Care Home Management;Neuromuscular re-education;Dry needling;Therapeutic activities;Therapeutic exercise;Cryotherapy;Electrical Stimulation;Iontophoresis /ml Dexamethasone;Moist Heat;Ultrasound;Manual techniques   PT Next Visit Plan progress HEP; strengthening   PT Home Exercise Plan postural work; ball release; HEP   Consulted and Agree with Plan of Care Patient        Problem List Patient Active Problem List   Diagnosis Date Noted  . Subacromial bursitis left 12/22/2014  . Osteoarthritis of left acromioclavicular joint 01/17/2014  . SEIZURE DISORDER 11/13/2009    Xachary Hambly Rober Minion PT, MPH  05/21/2015, 8:45 AM  [facility] [address] [place], [place], [address] Phone: [phone]   Fax:  [phone]  Name:  Stacey Woodard MRN: [id number] Date of Birth: Apr 05, 1946

## 2015-05-28 ENCOUNTER — Ambulatory Visit (INDEPENDENT_AMBULATORY_CARE_PROVIDER_SITE_OTHER): Payer: Worker's Compensation | Admitting: Rehabilitative and Restorative Service Providers"

## 2015-05-28 ENCOUNTER — Encounter: Payer: Self-pay | Admitting: Rehabilitative and Restorative Service Providers"

## 2015-05-28 DIAGNOSIS — M2560 Stiffness of unspecified joint, not elsewhere classified: Secondary | ICD-10-CM | POA: Diagnosis not present

## 2015-05-28 DIAGNOSIS — R531 Weakness: Secondary | ICD-10-CM

## 2015-05-28 DIAGNOSIS — R29898 Other symptoms and signs involving the musculoskeletal system: Secondary | ICD-10-CM | POA: Diagnosis not present

## 2015-05-28 DIAGNOSIS — Z7409 Other reduced mobility: Secondary | ICD-10-CM

## 2015-05-28 DIAGNOSIS — R293 Abnormal posture: Secondary | ICD-10-CM | POA: Diagnosis not present

## 2015-05-28 DIAGNOSIS — M25512 Pain in left shoulder: Secondary | ICD-10-CM | POA: Diagnosis not present

## 2015-05-28 NOTE — Therapy (Signed)
[facility] [address] [place], [place], [address] Phone: [phone]   Fax:  [phone]  Physical Therapy Treatment  Patient Details  Name: Stacey Woodard MRN: [id number] Date of Birth: 05/13/1946 Referring Provider: T  Encounter Date: 05/28/2015      PT End of Session - 05/28/15 1406    Visit Number 11   Number of Visits 12   Date for PT Re-Evaluation 06/01/15   PT Start Time 1402   PT Stop Time 1451   PT Time Calculation (min) 49 min   Activity Tolerance Patient tolerated treatment well      Past Medical History  Diagnosis Date  . Seizures (HCC)   . Frequency of urination     Past Surgical History  Procedure Laterality Date  . Knee arthroscopy    . Cesarean sections      x3    There were no vitals filed for this visit.  Visit Diagnosis:  Pain in shoulder region, left  Weakness of shoulder  Abnormal posture  Stiffness in joint  Decreased strength, endurance, and mobility      Subjective Assessment - 05/28/15 1407    Subjective Shoulder is doing ok - she is doing a lot of baking for the holidays. Doing most things with her arm - but just can't reach the third cabinet.    Currently in Pain? No/denies            [facility] PT Assessment - 05/28/15 0001    Assessment   Medical Diagnosis Lt AC arthritis/subacrominal bursitis   Referring Provider T   Onset Date/Surgical Date 01/07/14   Hand Dominance Right   Next MD Visit 12/16   AROM   Left Shoulder Flexion 136 Degrees   Left Shoulder ABduction 134 Degrees   Left Shoulder Internal Rotation 35 Degrees   Left Shoulder External Rotation 78 Degrees   Strength   Right/Left Shoulder --  5/5 bilat shd                     [facility] Adult PT Treatment/Exercise - 05/28/15 0001    Shoulder Exercises: Standing   External Rotation Strengthening;Both;20 reps;Theraband   Theraband Level (Shoulder External Rotation) Level 3 (Green)   Internal  Rotation Strengthening;Left;10 reps;Theraband   Theraband Level (Shoulder Internal Rotation) Level 2 (Red)   Extension Strengthening;Both;20 reps   Theraband Level (Shoulder Extension) Level 3 (Green)   Row Strengthening;Both;20 reps   Theraband Level (Shoulder Row) Level 3 (Green)   Retraction Strengthening;Both;20 reps;Theraband   Theraband Level (Shoulder Retraction) Level 1 (Yellow)   Other Standing Exercises reverse wall push ups 5 sec hold x 10    Shoulder Exercises: Pulleys   Other Pulley Exercises IR 20 sec x 3    Shoulder Exercises: Therapy Ball   Other Therapy Ball Exercises ball on wall multiple directions 1 min x 2   Shoulder Exercises: ROM/Strengthening   UBE (Upper Arm Bike) L4 x 5min alt FWD/BWD   Shoulder Exercises: Stretch   Corner Stretch Limitations doorway 3 postions; 30 sec x3 ea   Internal Rotation Stretch 2 reps   Other Shoulder Stretches lateral cervical flexion 20 sec x 3   Other Shoulder Stretches biceps stretch 30 sec x3   Shoulder Exercises: Body Blade   Flexion 30 seconds;2 reps   Modalities   Modalities Electrical Stimulation;Vasopneumatic   Programme researcher, broadcasting/film/videolectrical Stimulation   Electrical Stimulation Location Lt shoulder   Electrical Stimulation Action IFC   Electrical Stimulation Parameters to  tolerance    Electrical Stimulation Goals Pain   Vasopneumatic   Number Minutes Vasopneumatic  15 minutes   Vasopnuematic Location  Shoulder   Vasopneumatic Pressure Medium   Vasopneumatic Temperature  3*   Manual Therapy   Manual therapy comments pt supine   Joint Mobilization GH mob   Soft tissue mobilization Lt pecs    Scapular Mobilization Lt   Passive ROM Lt shoulder all planes                 PT Education - 05/28/15 1440    Education provided Yes   Education Details HEP   Person(s) Educated Patient   Methods Explanation   Comprehension Verbalized understanding             PT Long Term Goals - 05/18/15 0823    PT LONG TERM GOAL #1    Title 5/5 pain free strength Lt shoudler 06/01/15   Time 6   Period Weeks   Status On-going   PT LONG TERM GOAL #2   Title Improve postrue and alignment 06/01/15   Time 6   Period Weeks   Status On-going   PT LONG TERM GOAL #3   Title Patient reports that she can reach into overhead cabilnet for items with minimal to no pain 06/01/15   Time 6   Period Weeks   Status Achieved   PT LONG TERM GOAL #4   Title Increase Lt shoulder ROM to equal or greater than Rt shoudler ROM 06/01/15   Time 6   Period Weeks   Status On-going   PT LONG TERM GOAL #5   Title Improve FOTO to </= 37% limitation 06/01/15   Time 6   Period Weeks   Status On-going               Plan - 05/28/15 1441    Clinical Impression Statement Continued progess with increased ROM/strength/function Lt shoudler. Progressing well toward stated goals of treatment.    Pt will benefit from skilled therapeutic intervention in order to improve on the following deficits Postural dysfunction;Improper body mechanics;Decreased range of motion;Decreased mobility;Decreased strength;Decreased endurance;Decreased activity tolerance;Pain   Rehab Potential Good   PT Frequency 2x / week   PT Duration 6 weeks   PT Treatment/Interventions Patient/family education;ADLs/Self Care Home Management;Neuromuscular re-education;Dry needling;Therapeutic activities;Therapeutic exercise;Cryotherapy;Electrical Stimulation;Iontophoresis /ml Dexamethasone;Moist Heat;Ultrasound;Manual techniques   PT Next Visit Plan review HEP; anticipate d/c to I HEP   PT Home Exercise Plan postural work; ball release; HEP   Consulted and Agree with Plan of Care Patient        Problem List Patient Active Problem List   Diagnosis Date Noted  . Subacromial bursitis left 12/22/2014  . Osteoarthritis of left acromioclavicular joint 01/17/2014  . SEIZURE DISORDER 11/13/2009    Verline Kong Rober Minion PT, MPH 05/28/2015, 2:43 PM  [facility] [address] [place], [place], [address] Phone: [phone]   Fax:  [phone]  Name: Stacey Woodard MRN: [id number] Date of Birth: [birth year]

## 2015-06-01 ENCOUNTER — Encounter: Payer: Self-pay | Admitting: Rehabilitative and Restorative Service Providers"

## 2015-06-01 ENCOUNTER — Ambulatory Visit (INDEPENDENT_AMBULATORY_CARE_PROVIDER_SITE_OTHER): Payer: Worker's Compensation | Admitting: Rehabilitative and Restorative Service Providers"

## 2015-06-01 DIAGNOSIS — M25512 Pain in left shoulder: Secondary | ICD-10-CM | POA: Diagnosis not present

## 2015-06-01 DIAGNOSIS — R6889 Other general symptoms and signs: Secondary | ICD-10-CM

## 2015-06-01 DIAGNOSIS — M2560 Stiffness of unspecified joint, not elsewhere classified: Secondary | ICD-10-CM

## 2015-06-01 DIAGNOSIS — R29898 Other symptoms and signs involving the musculoskeletal system: Secondary | ICD-10-CM | POA: Diagnosis not present

## 2015-06-01 DIAGNOSIS — R531 Weakness: Secondary | ICD-10-CM

## 2015-06-01 DIAGNOSIS — R293 Abnormal posture: Secondary | ICD-10-CM

## 2015-06-01 DIAGNOSIS — Z7409 Other reduced mobility: Secondary | ICD-10-CM

## 2015-06-01 NOTE — Therapy (Signed)
[place], [place], [address] Phone: [phone]   Fax:  [phone]  Physical Therapy Treatment  Patient Details  Name: Shanicqua Coldren MRN: [id number] Date of Birth: 03-Nov-1945 Referring Provider: T  Encounter Date: 06/01/2015      PT End of Session - 06/01/15 1405    Visit Number 12   Number of Visits 12   Date for PT Re-Evaluation 06/01/15   PT Start Time 1402   PT Stop Time 0762   PT Time Calculation (min) 51 min   Activity Tolerance Patient tolerated treatment well      Past Medical History  Diagnosis Date  . Seizures ([place])   . Frequency of urination     Past Surgical History  Procedure Laterality Date  . Knee arthroscopy    . Cesarean sections      x3    There were no vitals filed for this visit.  Visit Diagnosis:  Pain in shoulder region, left  Weakness of shoulder  Abnormal posture  Stiffness in joint  Decreased strength, endurance, and mobility      Subjective Assessment - 06/01/15 1406    Subjective Sore in the back of the shoulder today - not sure why she's sore today.    Currently in Pain? No/denies            [facility] PT Assessment - 06/01/15 0001    Assessment   Medical Diagnosis Lt AC arthritis/subacrominal bursitis   Referring Provider T   Onset Date/Surgical Date 01/07/14   Hand Dominance Right   Next MD Visit 12/16   Observation/Other Assessments   Focus on Therapeutic Outcomes (FOTO)  39% limitation    AROM   Left Shoulder Flexion 136 Degrees   Left Shoulder ABduction 134 Degrees   Left Shoulder Internal Rotation 35 Degrees   Left Shoulder External Rotation 78 Degrees   Cervical - Right Side Bend 40   Cervical - Left Side Bend 37   Strength   Right/Left Shoulder --  5/5 bilat                      [facility] Adult PT Treatment/Exercise - 06/01/15 0001    Shoulder Exercises: Standing   External Rotation Strengthening;Both;20  reps;Theraband   Theraband Level (Shoulder External Rotation) Level 3 (Green)   Internal Rotation Strengthening;Left;10 reps;Theraband   Theraband Level (Shoulder Internal Rotation) Level 2 (Red)   Extension Strengthening;Both;20 reps   Theraband Level (Shoulder Extension) Level 3 (Green)   Row Strengthening;Both;20 reps   Theraband Level (Shoulder Row) Level 3 (Green)   Retraction Strengthening;Both;20 reps;Theraband   Theraband Level (Shoulder Retraction) Level 1 (Yellow)   Shoulder Exercises: Therapy Ball   Other Therapy Ball Exercises ball on wall multiple directions 1 min x 2   Shoulder Exercises: ROM/Strengthening   UBE (Upper Arm Bike) L4 x 108mn alt FWD/BWD   Shoulder Exercises: Stretch   Corner Stretch Limitations doorway 3 postions; 30 sec x3 ea   Internal Rotation Stretch 2 reps                PT Education - 06/01/15 1418    Education provided Yes   Education Details improtance of consistent HEP    Person(s) Educated Patient   Methods Explanation   Comprehension Verbalized understanding             PT Long Term Goals - 06/01/15 1416    PT LONG TERM GOAL #1   Title  5/5 pain free strength Lt shoudler 06/01/15   Time 6   Period Weeks   Status Achieved   PT LONG TERM GOAL #2   Title Improve postrue and alignment 06/01/15   Time 6   Period Weeks   Status Achieved   PT LONG TERM GOAL #3   Title Patient reports that she can reach into overhead cabilnet for items with minimal to no pain 06/01/15   Time 6   Period Weeks   Status Achieved   PT LONG TERM GOAL #4   Title Increase Lt shoulder ROM to equal or greater than Rt shoudler ROM 06/01/15   Time 6   Period Weeks   Status Achieved   PT LONG TERM GOAL #5   Title Improve FOTO to </= 37% limitation 06/01/15   Baseline FOTO 39% limitation    Status Partially Met               Plan - 06/01/15 1419    Clinical Impression Statement Excellent progress with shoulder rehab. Goals of treatment  have been accomplished.    Pt will benefit from skilled therapeutic intervention in order to improve on the following deficits Postural dysfunction;Improper body mechanics;Decreased range of motion;Decreased mobility;Decreased strength;Decreased endurance;Decreased activity tolerance;Pain   Rehab Potential Good   PT Frequency 2x / week   PT Duration 6 weeks   PT Treatment/Interventions Patient/family education;ADLs/Self Care Home Management;Neuromuscular re-education;Dry needling;Therapeutic activities;Therapeutic exercise;Cryotherapy;Electrical Stimulation;Iontophoresis 52m/ml Dexamethasone;Moist Heat;Ultrasound;Manual techniques   PT Next Visit Plan D/1C to I HEP    PT Home Exercise Plan postural work; ball release; HEP   Consulted and Agree with Plan of Care Patient        Problem List Patient Active Problem List   Diagnosis Date Noted  . Subacromial bursitis left 12/22/2014  . Osteoarthritis of left acromioclavicular joint 01/17/2014  . SEIZURE DISORDER 11/13/2009    Teddie Curd PNilda SimmerPT, MPH  06/01/2015, 2:47 PM  [facility] [address] [address]Phone: [phone]  Fax:  [phone] Name: MGeralda BaumgardnerMRN: [id number] of Birth: 402/09/1945 PHYSICAL THERAPY DISCHARGE SUMMARY  Visits from Start of Care: 12  Current functional level related to goals / functional outcomes: Goals accomplished returned to normal functional activities    Remaining deficits: Needs to continue stretching and strengthening   Education / Equipment: HEP/therabands  Plan: Patient agrees to discharge.  Patient goals were partially met. Patient is being discharged due to being pleased with the current functional level.  ?????   Camryn Lampson P. HHelene KelpPT, MPH 06/01/2015 2:48 PM

## 2015-06-17 ENCOUNTER — Encounter: Payer: Self-pay | Admitting: Sports Medicine

## 2015-06-17 ENCOUNTER — Ambulatory Visit (INDEPENDENT_AMBULATORY_CARE_PROVIDER_SITE_OTHER): Payer: Worker's Compensation | Admitting: Sports Medicine

## 2015-06-17 VITALS — BP 133/60 | HR 86 | Wt 174.0 lb

## 2015-06-17 DIAGNOSIS — M1990 Unspecified osteoarthritis, unspecified site: Secondary | ICD-10-CM

## 2015-06-17 DIAGNOSIS — M19012 Primary osteoarthritis, left shoulder: Secondary | ICD-10-CM

## 2015-06-17 DIAGNOSIS — M7552 Bursitis of left shoulder: Secondary | ICD-10-CM | POA: Diagnosis not present

## 2015-06-17 NOTE — Assessment & Plan Note (Signed)
Pain is now resolved after formal physical therapy and injection. 

## 2015-06-17 NOTE — Progress Notes (Signed)
  Subjective:    CC: follow-up  HPI: Stacey Woodard returns, we injected her left acromioclavicular and subacromial bursa, she returns today essentially pain-free, she has been very compliant with physical therapy and is happy with how things are going so far.  Past medical history, Surgical history, Family history not pertinant except as noted below, Social history, Allergies, and medications have been entered into the medical record, reviewed, and no changes needed.   Review of Systems: No fevers, chills, night sweats, weight loss, chest pain, or shortness of breath.   Objective:    General: Well Developed, well nourished, and in no acute distress.  Neuro: Alert and oriented x3, extra-ocular muscles intact, sensation grossly intact.  HEENT: Normocephalic, atraumatic, pupils equal round reactive to light, neck supple, no masses, no lymphadenopathy, thyroid nonpalpable.  Skin: Warm and dry, no rashes. Cardiac: Regular rate and rhythm, no murmurs rubs or gallops, no lower extremity edema.  Respiratory: Clear to auscultation bilaterally. Not using accessory muscles, speaking in full sentences. Left Shoulder: Inspection reveals no abnormalities, atrophy or asymmetry. Palpation is normal with no tenderness over AC joint or bicipital groove. ROM is full in all planes. Rotator cuff strength normal throughout. No signs of impingement with negative Neer and Hawkin's tests, empty can. Speeds and Yergason's tests normal. No labral pathology noted with negative Obrien's, negative crank, negative clunk, and good stability. Normal scapular function observed. No painful arc and no drop arm sign. No apprehension sign  Impression and Recommendations:

## 2015-06-17 NOTE — Assessment & Plan Note (Signed)
Pain is now resolved after formal physical therapy and injection.

## 2015-08-24 IMAGING — CR DG SHOULDER 2+V*L*
3 series · 3 of 3 positions shown · non-contrast
Comparison: No priors.

CLINICAL DATA: Left shoulder pain.

EXAM:
LEFT SHOULDER - 2+ VIEW

[view not recorded (1 of 3)]
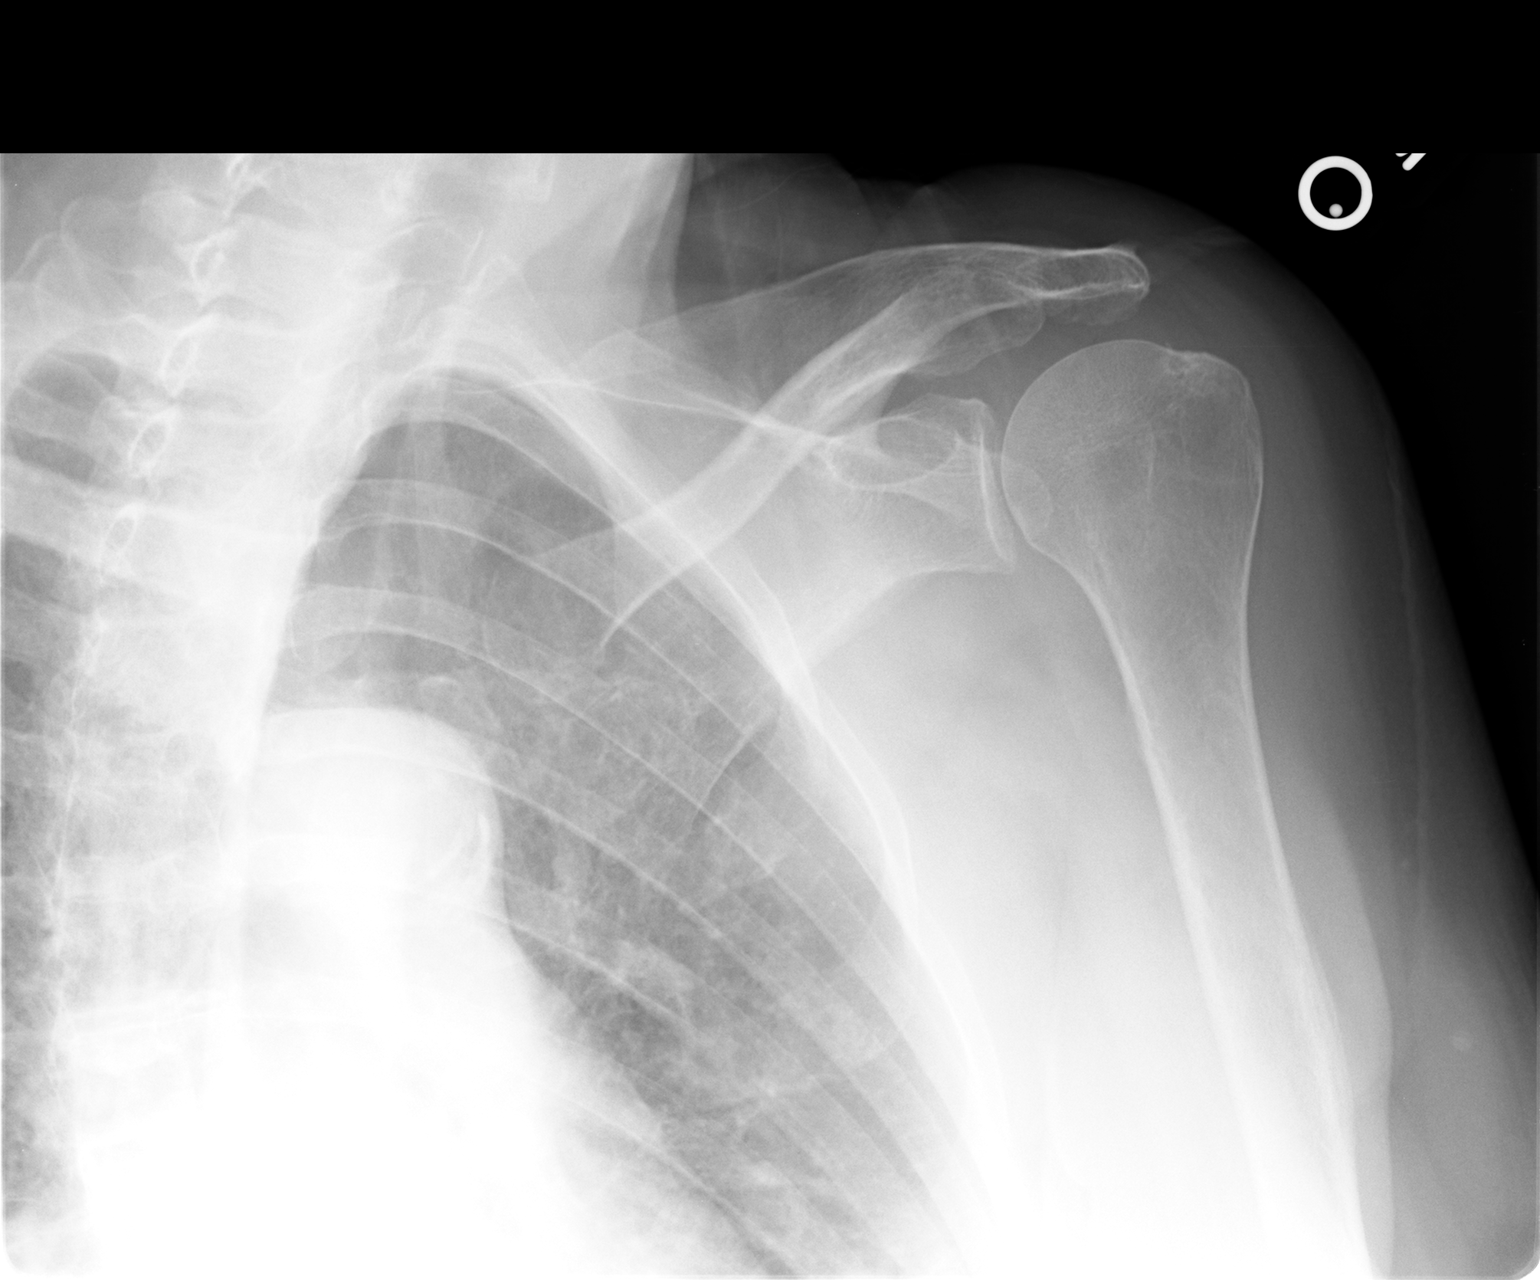

[view not recorded (2 of 3)]
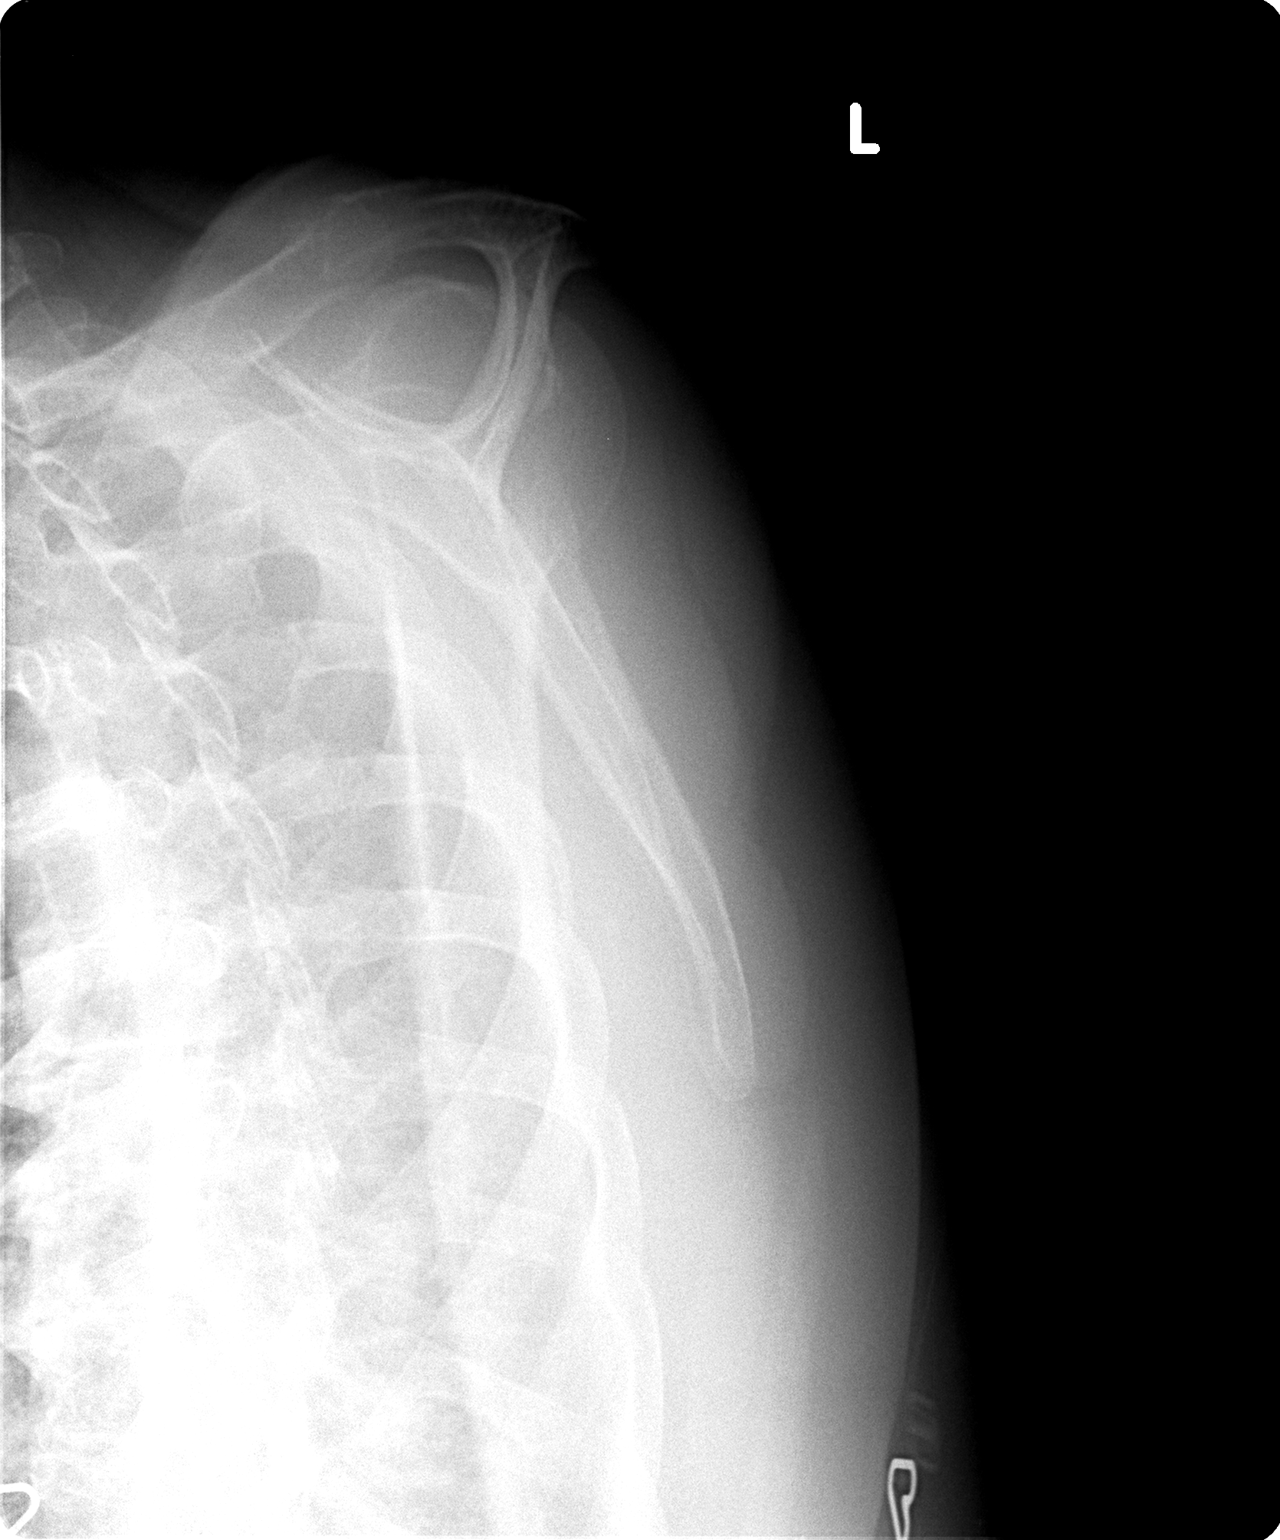

[view not recorded (3 of 3)]
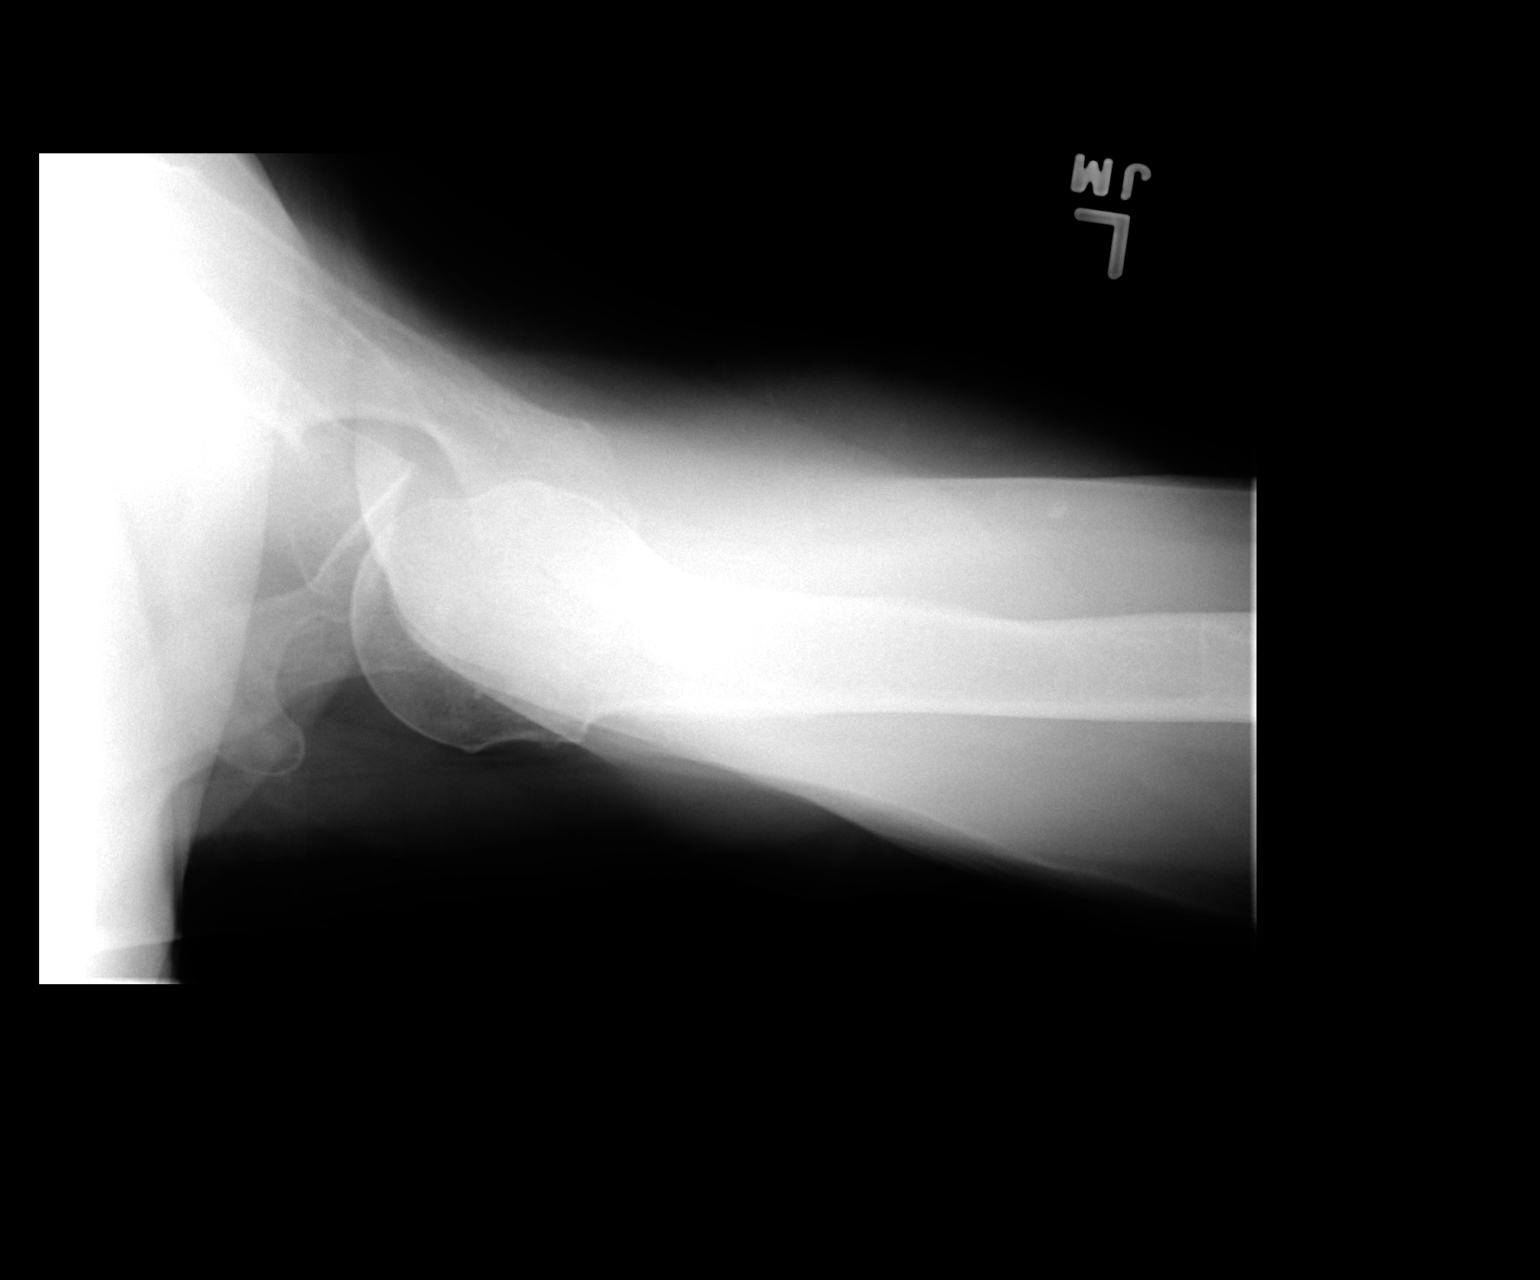

[3 of 3 positions shown; findings below may reference images not displayed]

FINDINGS: Multiple views of the left shoulder demonstrate no acute displaced
fracture, subluxation, dislocation, or soft tissue abnormality.
IMPRESSION: No acute radiographic abnormality of the left shoulder.

## 2016-08-05 IMAGING — CR DG SHOULDER 2+V*L*
3 series · 3 of 3 positions shown · non-contrast
Comparison: None.

CLINICAL DATA: Left subacromial bursitis, pain x1 year, worse with
movement

EXAM:
LEFT SHOULDER - 2+ VIEW

[shoulder grashey]
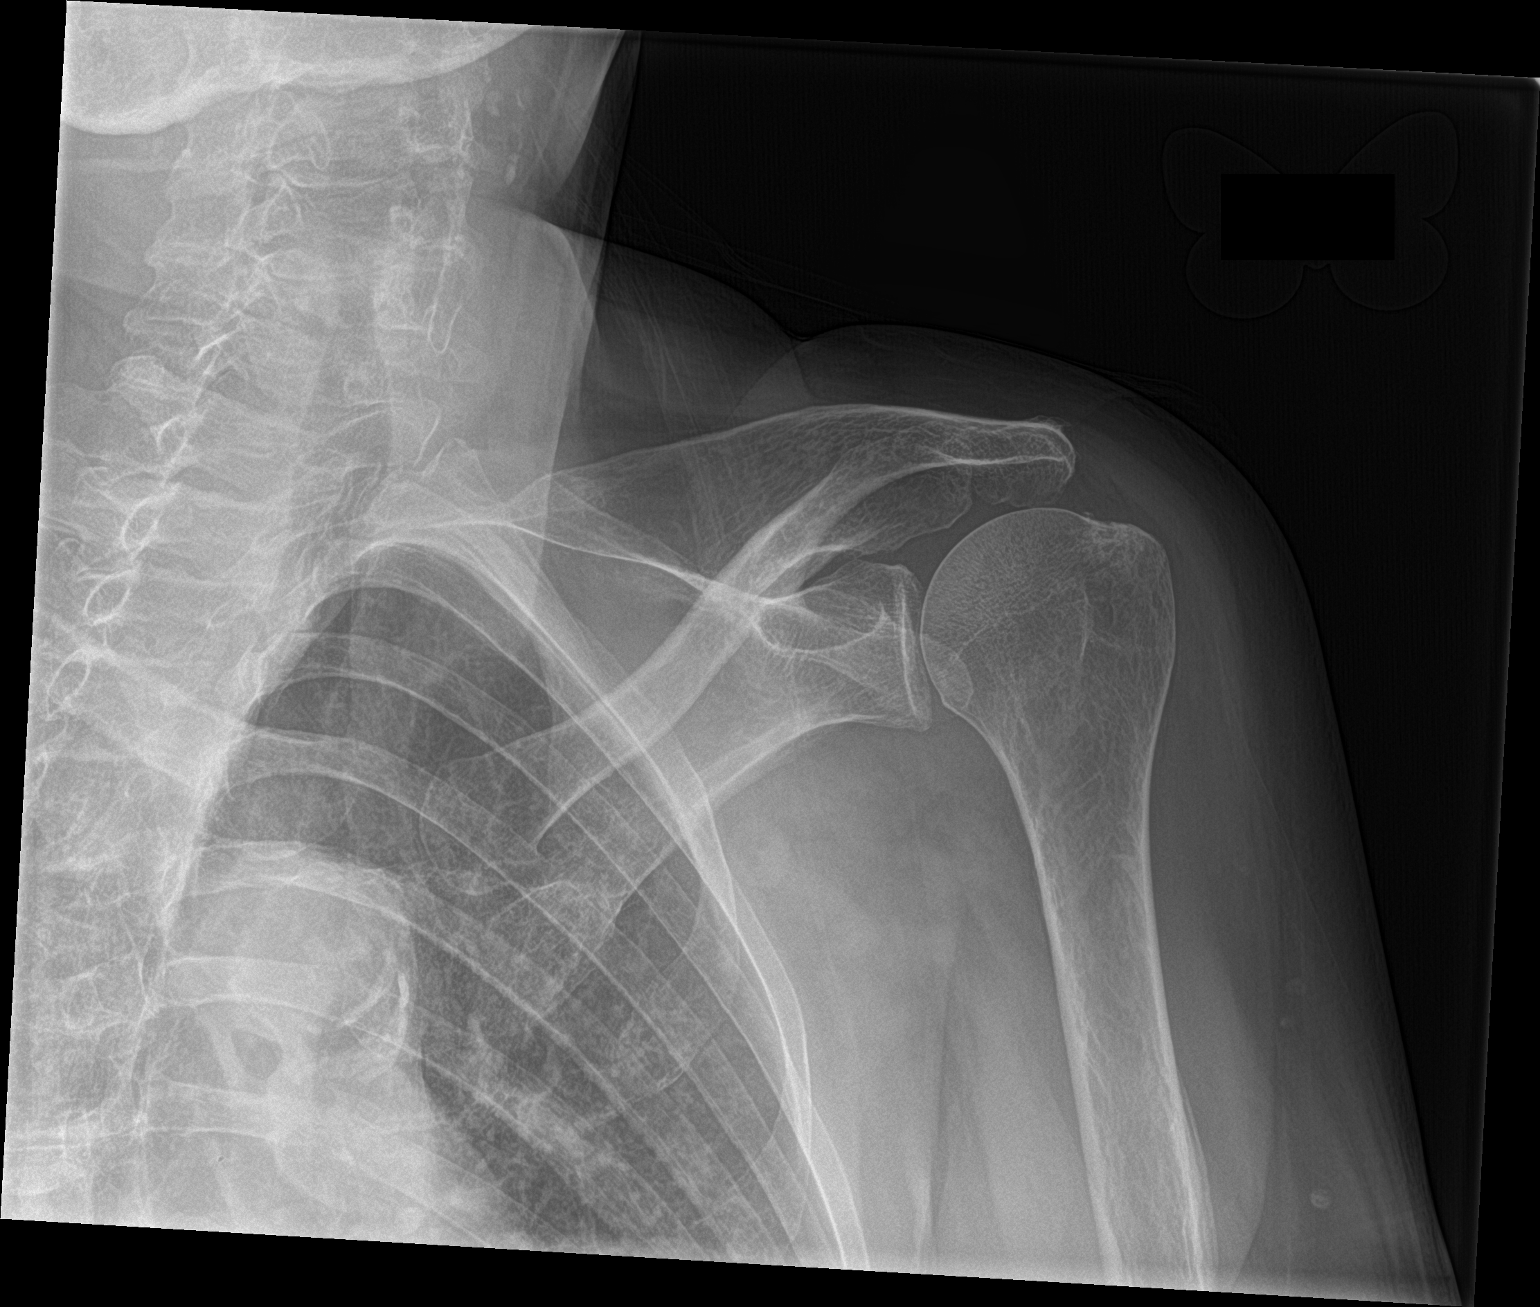

[shoulder y view]
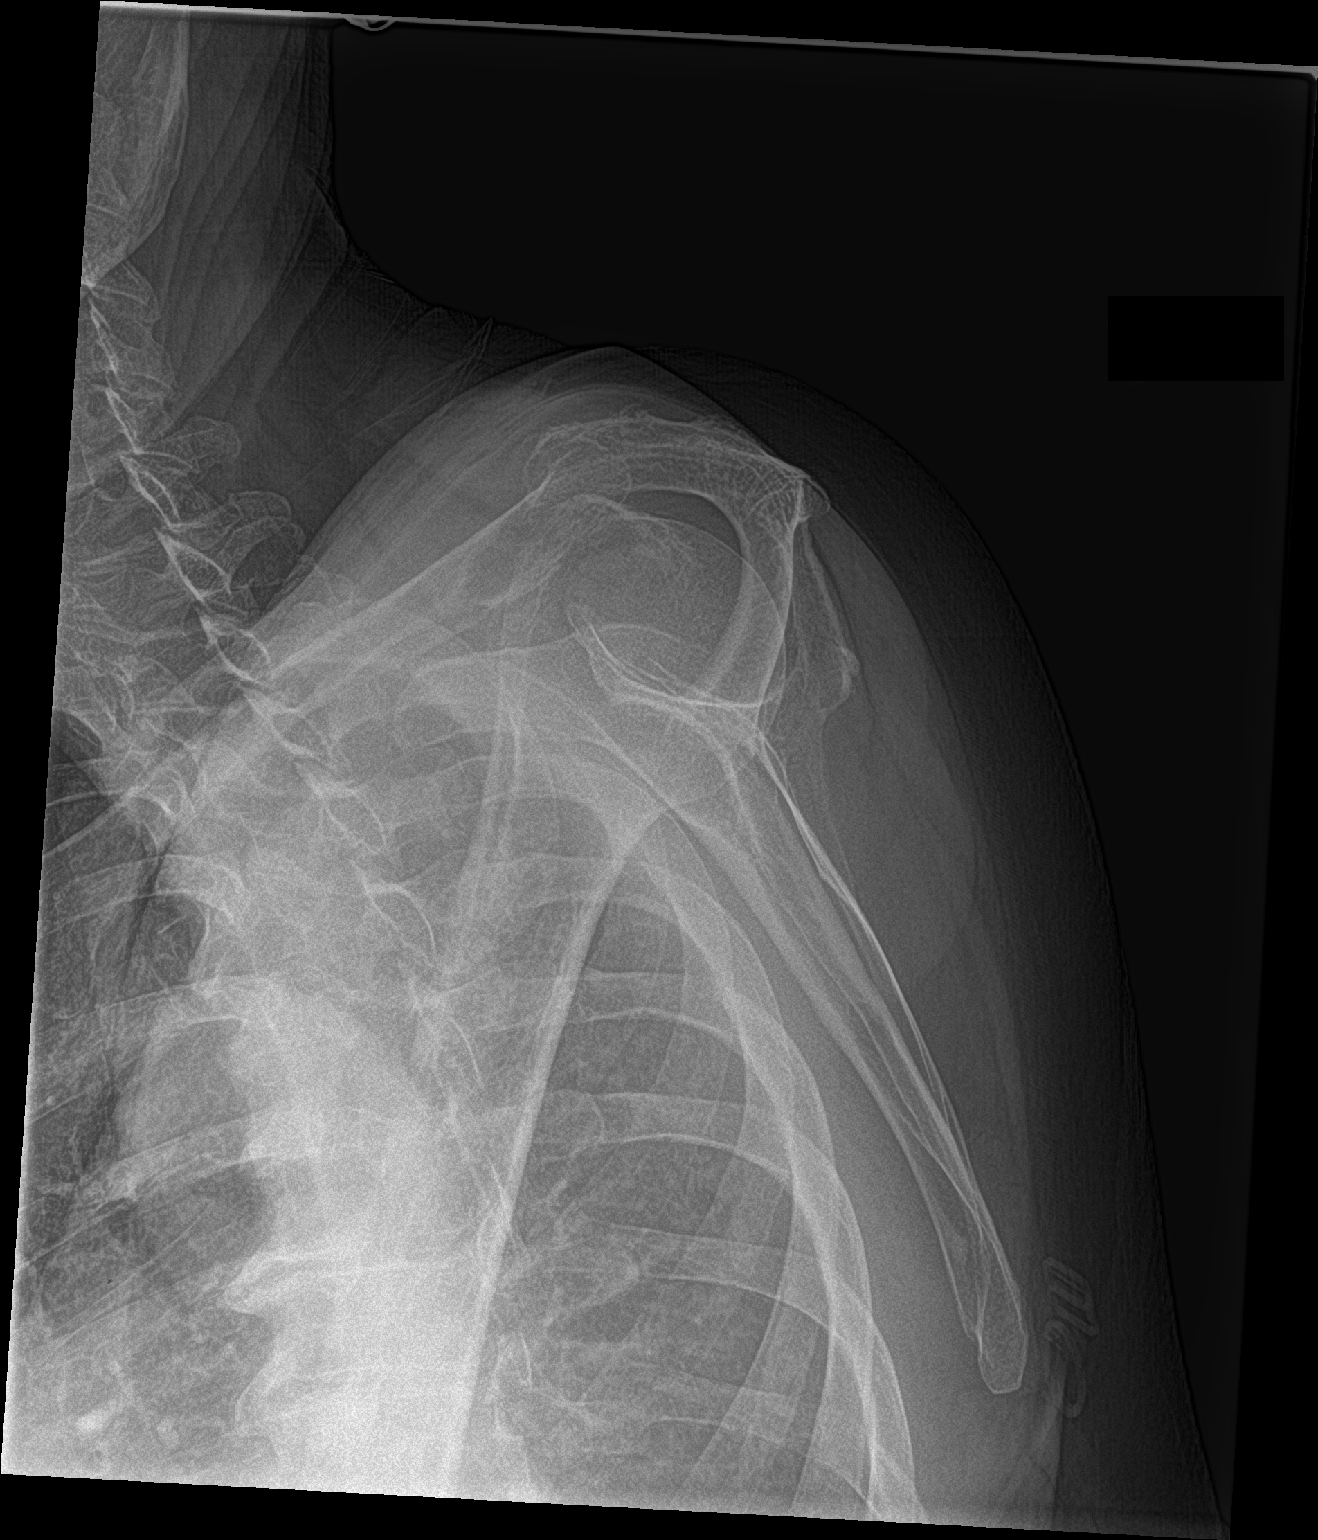

[shoulder axillary]
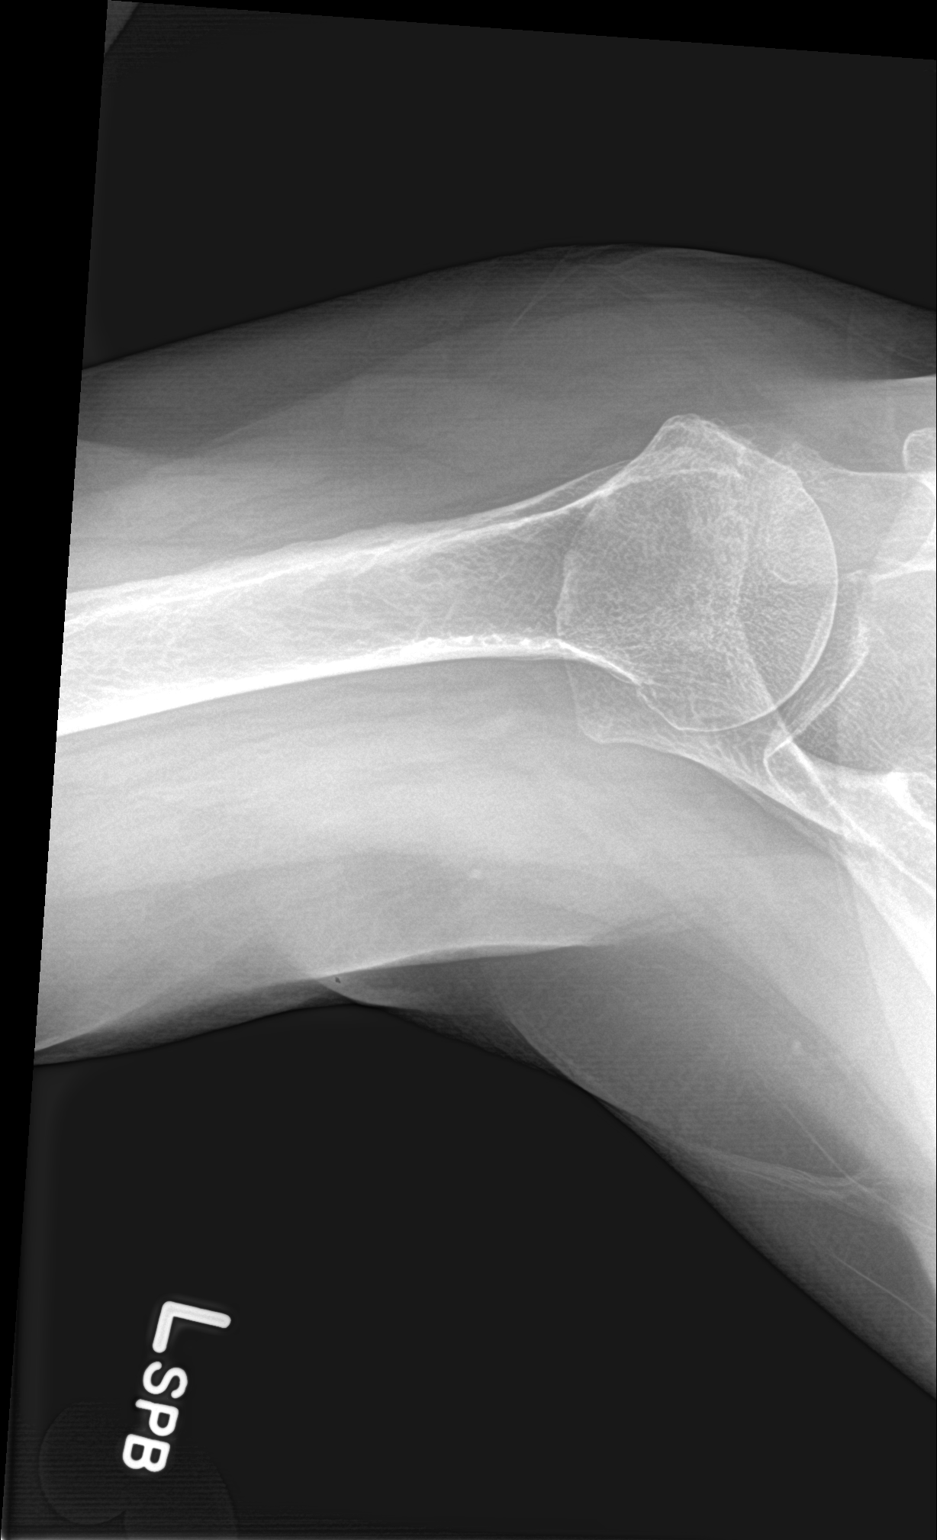

[3 of 3 positions shown; findings below may reference images not displayed]

FINDINGS: No fracture or dislocation is seen.

Glenohumeral joint is preserved. Degenerative changes with
subacromial spurring.

Visualized soft tissues are within normal limits.

Visualized left lung is clear.
IMPRESSION: No fracture or dislocation is seen.

Degenerative changes with subacromial spurring.
